# Patient Record
Sex: Male | Born: 1955
Health system: Southern US, Community
[De-identification: ages and names within clinical notes are randomized; demographics above are authoritative.]

## PROBLEM LIST (undated history)

## (undated) DIAGNOSIS — E079 Disorder of thyroid, unspecified: Secondary | ICD-10-CM

## (undated) DIAGNOSIS — I10 Essential (primary) hypertension: Secondary | ICD-10-CM

## (undated) DIAGNOSIS — E785 Hyperlipidemia, unspecified: Secondary | ICD-10-CM

## (undated) HISTORY — DX: Essential (primary) hypertension: I10

## (undated) HISTORY — PX: OTHER SURGICAL HISTORY: SHX169

## (undated) HISTORY — PX: BLADDER SURGERY: SHX569

## (undated) HISTORY — DX: Disorder of thyroid, unspecified: E07.9

## (undated) HISTORY — DX: Hyperlipidemia, unspecified: E78.5

---

## 2009-08-23 ENCOUNTER — Emergency Department (HOSPITAL_COMMUNITY): Admission: EM | Admit: 2009-08-23 | Discharge: 2009-08-23 | Payer: Self-pay | Admitting: Emergency Medicine

## 2010-12-27 LAB — URINALYSIS, ROUTINE W REFLEX MICROSCOPIC
Bilirubin Urine: NEGATIVE
Glucose, UA: NEGATIVE mg/dL
Ketones, ur: NEGATIVE mg/dL
Nitrite: NEGATIVE
Protein, ur: NEGATIVE mg/dL
Specific Gravity, Urine: 1.024 (ref 1.005–1.030)
pH: 6 (ref 5.0–8.0)

## 2010-12-27 LAB — COMPREHENSIVE METABOLIC PANEL
ALT: 25 U/L (ref 0–53)
Alkaline Phosphatase: 61 U/L (ref 39–117)
Chloride: 105 mEq/L (ref 96–112)
GFR calc non Af Amer: >60 mL/min (ref >60)

## 2010-12-27 LAB — DIFFERENTIAL
Basophils Absolute: 0 10*3/uL (ref 0.0–0.1)
Basophils Relative: 1 % (ref 0–1)
Eosinophils Absolute: 0.2 10*3/uL (ref 0.0–0.7)
Eosinophils Relative: 4 % (ref 0–5)
Lymphocytes Relative: 40 % (ref 12–46)
Lymphs Abs: 2.2 10*3/uL (ref 0.7–4.0)

## 2010-12-27 LAB — CBC
MCV: 92.1 fL (ref 78.0–100.0)
WBC: 5.5 10*3/uL (ref 4.0–10.5)

## 2011-12-19 ENCOUNTER — Ambulatory Visit (INDEPENDENT_AMBULATORY_CARE_PROVIDER_SITE_OTHER): Payer: BC Managed Care – PPO | Admitting: Family Medicine

## 2011-12-19 VITALS — BP 128/76 | HR 87 | Temp 98.4°F | Resp 18 | Ht 68.5 in | Wt 214.0 lb

## 2011-12-19 DIAGNOSIS — R599 Enlarged lymph nodes, unspecified: Secondary | ICD-10-CM

## 2011-12-19 DIAGNOSIS — M25879 Other specified joint disorders, unspecified ankle and foot: Secondary | ICD-10-CM

## 2011-12-19 DIAGNOSIS — E785 Hyperlipidemia, unspecified: Secondary | ICD-10-CM | POA: Insufficient documentation

## 2011-12-19 DIAGNOSIS — I10 Essential (primary) hypertension: Secondary | ICD-10-CM

## 2011-12-19 DIAGNOSIS — I129 Hypertensive chronic kidney disease with stage 1 through stage 4 chronic kidney disease, or unspecified chronic kidney disease: Secondary | ICD-10-CM | POA: Insufficient documentation

## 2011-12-19 LAB — POCT CBC
Granulocyte percent: 54.9 %G (ref 37–80)
HCT, POC: 46.1 % (ref 43.5–53.7)
Lymph, poc: 2.6 (ref 0.6–3.4)
MID (cbc): 0.7 (ref 0–0.9)
POC Granulocyte: 4.1 (ref 2–6.9)
POC LYMPH PERCENT: 35.2 %L (ref 10–50)
POC MID %: 9.9 %M (ref 0–12)
Platelet Count, POC: 275 10*3/uL (ref 142–424)

## 2011-12-19 NOTE — Progress Notes (Signed)
  Subjective:    Patient ID: Donald Zhang, male    DOB: 28-Feb-1956, 56 y.o.   MRN: [id number]  HPI Patient presents concerned about knots he discovered today on (R) nape and (L) dorsal surface of foot. Lesions are not tender. Denies recent illness though does have seasonal allergies. Denies injury to foot  though work involved very physical labor. Patient quit smoking 11/12 however recently experienced stressors and resumed smoking 1 week ago.  Smoking 3 cigarettes a day.   Review of Systems     Objective:   Physical Exam  Constitutional: He appears well-developed and well-nourished.  Cardiovascular: Normal rate, regular rhythm and normal heart sounds.   Pulmonary/Chest: Effort normal and breath sounds normal.  Lymphadenopathy:    He has cervical adenopathy (R) posterior cervical node.  Neurological: He is alert.  Skin: Skin is warm.       Soft non adherent lesion (R) posterior cervical region 1 cm X 1.5 cm  Cystic lesion (R) dorsal surface of foot with 2 cm x 1.5 cm lesion; fluctuant and not adherent to tendon          Assessment & Plan:

## 2011-12-25 ENCOUNTER — Other Ambulatory Visit: Payer: Self-pay

## 2012-01-28 ENCOUNTER — Ambulatory Visit (INDEPENDENT_AMBULATORY_CARE_PROVIDER_SITE_OTHER): Payer: BC Managed Care – PPO | Admitting: Family Medicine

## 2012-01-28 ENCOUNTER — Encounter: Payer: Self-pay | Admitting: Family Medicine

## 2012-01-28 VITALS — BP 129/72 | HR 66 | Temp 98.6°F | Resp 16 | Ht 68.5 in | Wt 206.0 lb

## 2012-01-28 DIAGNOSIS — F17200 Nicotine dependence, unspecified, uncomplicated: Secondary | ICD-10-CM

## 2012-01-28 DIAGNOSIS — I10 Essential (primary) hypertension: Secondary | ICD-10-CM

## 2012-01-28 DIAGNOSIS — E785 Hyperlipidemia, unspecified: Secondary | ICD-10-CM

## 2012-01-28 LAB — COMPREHENSIVE METABOLIC PANEL
AST: 16 U/L (ref 0–37)
Albumin: 4.3 g/dL (ref 3.5–5.2)
Alkaline Phosphatase: 50 U/L (ref 39–117)
Chloride: 108 mEq/L (ref 96–112)
Potassium: 4.5 mEq/L (ref 3.5–5.3)
Sodium: 142 mEq/L (ref 135–145)
Total Bilirubin: 0.5 mg/dL (ref 0.3–1.2)
Total Protein: 6.7 g/dL (ref 6.0–8.3)

## 2012-01-28 LAB — LIPID PANEL: VLDL: 37 mg/dL (ref 0–40)

## 2012-01-28 MED ORDER — LISINOPRIL-HYDROCHLOROTHIAZIDE 20-12.5 MG PO TABS: 1.0000 | ORAL_TABLET | Freq: Every day | ORAL | Status: DC

## 2012-01-28 MED ORDER — PRAVASTATIN SODIUM 40 MG PO TABS: 40.0000 mg | ORAL_TABLET | Freq: Every day | ORAL | Status: DC

## 2012-01-28 NOTE — Progress Notes (Signed)
  Subjective:    Patient ID: HYLAND MOLLENKOPF, male    DOB: 24-Oct-1955, 56 y.o.   MRN: [id number]  HPI  Patient presents for routine follow up of his medical problems  Compliant with his medications without side effects. Still smoking several cigarettes a day.   Follows very low salt diet. Very active at work however no regular exercise. Review of Systems  Eyes: Negative for visual disturbance.  Respiratory: Negative for shortness of breath.   Cardiovascular: Negative for chest pain and leg swelling.  Neurological: Negative for headaches.       Objective:   Physical Exam  Constitutional: He appears well-developed.  Eyes: Scleral icterus (muddy sclera) is present.  Neck: Neck supple.  Cardiovascular: Normal rate, regular rhythm and normal heart sounds.   Pulmonary/Chest: Effort normal and breath sounds normal.  Abdominal: Soft. Bowel sounds are normal.       No HSM  Musculoskeletal: Normal range of motion.  Neurological: He is alert.  Skin: Skin is warm.       No LE edema  Psychiatric: He has a normal mood and affect.        Assessment & Plan:   1. HTN (hypertension)  Lipid panel, Comprehensive metabolic panel, lisinopril-hydrochlorothiazide (PRINZIDE,ZESTORETIC) 20-12.5 MG per tablet  2. Dyslipidemia  pravastatin (PRAVACHOL) 40 MG tablet  3. Tobacco dependence  Encouraged cessation   Follow up 1 year for CPE unless blood work today dictates otherwise

## 2012-02-07 ENCOUNTER — Other Ambulatory Visit: Payer: Self-pay | Admitting: Family Medicine

## 2012-02-07 DIAGNOSIS — N189 Chronic kidney disease, unspecified: Secondary | ICD-10-CM

## 2012-02-07 DIAGNOSIS — I10 Essential (primary) hypertension: Secondary | ICD-10-CM

## 2012-02-07 DIAGNOSIS — E785 Hyperlipidemia, unspecified: Secondary | ICD-10-CM

## 2012-02-07 MED ORDER — ATORVASTATIN CALCIUM 40 MG PO TABS: 40.0000 mg | ORAL_TABLET | Freq: Every day | ORAL | Status: DC

## 2012-02-08 ENCOUNTER — Other Ambulatory Visit: Payer: Self-pay | Admitting: Family Medicine

## 2012-02-08 DIAGNOSIS — R945 Abnormal results of liver function studies: Secondary | ICD-10-CM

## 2012-02-08 DIAGNOSIS — R7989 Other specified abnormal findings of blood chemistry: Secondary | ICD-10-CM

## 2012-02-13 ENCOUNTER — Other Ambulatory Visit: Payer: Self-pay

## 2012-02-14 ENCOUNTER — Ambulatory Visit
Admission: RE | Admit: 2012-02-14 | Discharge: 2012-02-14 | Disposition: A | Discharge status: Home / Self Care | Payer: BC Managed Care – PPO | Source: Ambulatory Visit | Attending: Physician Assistant | Admitting: Physician Assistant

## 2012-02-14 ENCOUNTER — Ambulatory Visit
Admission: RE | Admit: 2012-02-14 | Discharge: 2012-02-14 | Disposition: A | Discharge status: Home / Self Care | Payer: BC Managed Care – PPO | Source: Ambulatory Visit | Attending: Family Medicine | Admitting: Family Medicine

## 2012-02-14 DIAGNOSIS — E785 Hyperlipidemia, unspecified: Secondary | ICD-10-CM

## 2012-02-14 DIAGNOSIS — N189 Chronic kidney disease, unspecified: Secondary | ICD-10-CM

## 2012-02-14 DIAGNOSIS — I10 Essential (primary) hypertension: Secondary | ICD-10-CM

## 2012-02-14 DIAGNOSIS — R945 Abnormal results of liver function studies: Secondary | ICD-10-CM

## 2012-02-27 ENCOUNTER — Telehealth: Payer: Self-pay | Admitting: Radiology

## 2012-02-27 NOTE — Telephone Encounter (Signed)
Could not leave message on machine--it said "voicemail has not been set up yet"

## 2012-02-27 NOTE — Telephone Encounter (Signed)
Message copied by Luretha Murphy on Wed Feb 27, 2012  8:45 AM ------      Message from: Dois Davenport      Created: Sun Feb 24, 2012  5:20 PM       Please contact patient and let him know his renal artery dopplers are essentially normal      Encourage him to see Dr. Audria Nine the end of June to recheck renal function.

## 2012-03-11 ENCOUNTER — Telehealth: Payer: Self-pay | Admitting: Radiology

## 2012-03-11 NOTE — Telephone Encounter (Signed)
GAVE PT RESULTS OF U/S.

## 2012-03-11 NOTE — Telephone Encounter (Signed)
Message copied by Luretha Murphy on Tue Mar 11, 2012  8:51 AM ------      Message from: Fidel Levy      Created: Fri Mar 07, 2012  8:32 AM                   ----- Message -----         From: Earlie Counts, [place]         Sent: 03/07/2012   8:32 AM           To: [facility] Message Pool                        ----- Message -----         From: Dois Davenport, MD         Sent: 02/24/2012   5:20 PM           To: [facility] Clinical Message Pool            Please contact patient and let him know his renal artery dopplers are essentially normal      Encourage him to see Dr. Audria Nine the end of June to recheck renal function.

## 2012-11-22 DIAGNOSIS — Z0271 Encounter for disability determination: Secondary | ICD-10-CM

## 2013-01-04 ENCOUNTER — Ambulatory Visit: Payer: BC Managed Care – PPO

## 2013-01-04 ENCOUNTER — Ambulatory Visit (INDEPENDENT_AMBULATORY_CARE_PROVIDER_SITE_OTHER): Payer: BC Managed Care – PPO | Admitting: Emergency Medicine

## 2013-01-04 VITALS — BP 146/90 | HR 79 | Temp 98.2°F | Resp 18 | Ht 69.0 in | Wt 194.2 lb

## 2013-01-04 DIAGNOSIS — Z0189 Encounter for other specified special examinations: Secondary | ICD-10-CM

## 2013-01-04 DIAGNOSIS — S8000XA Contusion of unspecified knee, initial encounter: Secondary | ICD-10-CM

## 2013-01-04 DIAGNOSIS — S8002XA Contusion of left knee, initial encounter: Secondary | ICD-10-CM

## 2013-01-04 MED ORDER — HYDROCODONE-ACETAMINOPHEN 5-325 MG PO TABS: 1.0000 | ORAL_TABLET | Freq: Four times a day (QID) | ORAL | Status: DC | PRN

## 2013-01-04 NOTE — Progress Notes (Signed)
  Subjective:    Patient ID: Donald Zhang, male    DOB: [birth year], 56 y.o.   MRN: [id number]  HPI 10 days ago patient was at home and he struck his left knee are cross tie. Since that time he has had significant pain and swelling where he struck his knee. On Friday he put up 200 bails of hay.    Review of Systems     Objective:   Physical Exam there is tenderness and swelling over the lateral femoral condyle and over the lateral portion of the patella. There is no joint effusion. There is no joint instability. He has pain in the area of swelling with flexion extension  [facility] reading (PRIMARY) by  Dr. 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       Assessment & Plan:  Injury to knee. 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 Patient placed in the knee immobilizer. We'll follow up with orthopedic surgeon he is given medications for pain and a week at work

## 2013-04-17 ENCOUNTER — Telehealth: Payer: Self-pay

## 2013-04-17 DIAGNOSIS — I10 Essential (primary) hypertension: Secondary | ICD-10-CM

## 2013-04-17 MED ORDER — LISINOPRIL-HYDROCHLOROTHIAZIDE 20-12.5 MG PO TABS: 1.0000 | ORAL_TABLET | Freq: Every day | ORAL | Status: DC

## 2013-04-17 NOTE — Telephone Encounter (Signed)
Pharm reqs RF of lisinopril-HCTZ. Pt is overdue for OV. Will RF x 1 mos w/notice needs OV.

## 2013-11-02 ENCOUNTER — Ambulatory Visit (INDEPENDENT_AMBULATORY_CARE_PROVIDER_SITE_OTHER): Payer: BC Managed Care – PPO | Admitting: Family Medicine

## 2013-11-02 ENCOUNTER — Encounter: Payer: Self-pay | Admitting: Family Medicine

## 2013-11-02 ENCOUNTER — Ambulatory Visit: Payer: BC Managed Care – PPO

## 2013-11-02 VITALS — BP 160/100 | HR 67 | Temp 98.0°F | Resp 16 | Ht 68.75 in | Wt 196.0 lb

## 2013-11-02 DIAGNOSIS — R590 Localized enlarged lymph nodes: Secondary | ICD-10-CM

## 2013-11-02 DIAGNOSIS — I491 Atrial premature depolarization: Secondary | ICD-10-CM

## 2013-11-02 DIAGNOSIS — D170 Benign lipomatous neoplasm of skin and subcutaneous tissue of head, face and neck: Secondary | ICD-10-CM

## 2013-11-02 DIAGNOSIS — L918 Other hypertrophic disorders of the skin: Secondary | ICD-10-CM

## 2013-11-02 DIAGNOSIS — L309 Dermatitis, unspecified: Secondary | ICD-10-CM

## 2013-11-02 DIAGNOSIS — I10 Essential (primary) hypertension: Secondary | ICD-10-CM

## 2013-11-02 DIAGNOSIS — D1739 Benign lipomatous neoplasm of skin and subcutaneous tissue of other sites: Secondary | ICD-10-CM

## 2013-11-02 DIAGNOSIS — L259 Unspecified contact dermatitis, unspecified cause: Secondary | ICD-10-CM

## 2013-11-02 DIAGNOSIS — F17200 Nicotine dependence, unspecified, uncomplicated: Secondary | ICD-10-CM

## 2013-11-02 DIAGNOSIS — L919 Hypertrophic disorder of the skin, unspecified: Secondary | ICD-10-CM

## 2013-11-02 DIAGNOSIS — L909 Atrophic disorder of skin, unspecified: Secondary | ICD-10-CM

## 2013-11-02 DIAGNOSIS — E785 Hyperlipidemia, unspecified: Secondary | ICD-10-CM

## 2013-11-02 LAB — COMPREHENSIVE METABOLIC PANEL
ALBUMIN: 4.3 g/dL (ref 3.5–5.2)
ALT: 12 U/L (ref 0–53)
AST: 16 U/L (ref 0–37)
Alkaline Phosphatase: 58 U/L (ref 39–117)
BUN: 12 mg/dL (ref 6–23)
CALCIUM: 9.2 mg/dL (ref 8.4–10.5)
CHLORIDE: 105 meq/L (ref 96–112)
CO2: 27 meq/L (ref 19–32)
CREATININE: 1.25 mg/dL (ref 0.50–1.35)
GLUCOSE: 95 mg/dL (ref 70–99)
POTASSIUM: 4.7 meq/L (ref 3.5–5.3)
Sodium: 139 mEq/L (ref 135–145)
Total Bilirubin: 0.7 mg/dL (ref 0.2–1.2)
Total Protein: 6.8 g/dL (ref 6.0–8.3)

## 2013-11-02 LAB — POCT URINALYSIS DIPSTICK
BILIRUBIN UA: NEGATIVE
Glucose, UA: NEGATIVE
Ketones, UA: NEGATIVE
Leukocytes, UA: NEGATIVE
Nitrite, UA: NEGATIVE
Protein, UA: NEGATIVE
Spec Grav, UA: 1.02
Urobilinogen, UA: 0.2
pH, UA: 6.5

## 2013-11-02 LAB — POCT CBC
Granulocyte percent: 64.8 %G (ref 37–80)
HCT, POC: 47.2 % (ref 43.5–53.7)
Hemoglobin: 15 g/dL (ref 14.1–18.1)
LYMPH, POC: 1.7 (ref 0.6–3.4)
MCH, POC: 30.5 pg (ref 27–31.2)
MCHC: 31.8 g/dL (ref 31.8–35.4)
MCV: 96 fL (ref 80–97)
MID (CBC): 0.3 (ref 0–0.9)
MPV: 9.4 fL (ref 0–99.8)
POC Granulocyte: 3.7 (ref 2–6.9)
POC LYMPH PERCENT: 29.3 %L (ref 10–50)
POC MID %: 5.9 % (ref 0–12)
Platelet Count, POC: 215 10*3/uL (ref 142–424)
RBC: 4.92 M/uL (ref 4.69–6.13)
RDW, POC: 13.9 %
WBC: 5.7 10*3/uL (ref 4.6–10.2)

## 2013-11-02 LAB — LIPID PANEL
Cholesterol: 146 mg/dL (ref 0–200)
HDL: 35 mg/dL — AB (ref >39)
LDL Cholesterol: 90 mg/dL (ref 0–99)
Total CHOL/HDL Ratio: 4.2 Ratio
Triglycerides: 104 mg/dL (ref <150)
VLDL: 21 mg/dL (ref 0–40)

## 2013-11-02 MED ORDER — TRIAMCINOLONE ACETONIDE 0.1 % EX CREA: 1.0000 "application " | TOPICAL_CREAM | Freq: Two times a day (BID) | CUTANEOUS | Status: DC

## 2013-11-02 MED ORDER — LISINOPRIL-HYDROCHLOROTHIAZIDE 20-12.5 MG PO TABS: ORAL_TABLET | ORAL | Status: DC

## 2013-11-02 MED ORDER — ATORVASTATIN CALCIUM 40 MG PO TABS: 40.0000 mg | ORAL_TABLET | Freq: Every day | ORAL | Status: DC

## 2013-11-02 NOTE — Patient Instructions (Addendum)
Use the triamcinolone cream twice daily for one week on the rashes, then once daily usually will take care of it.  Work hard on stopping cigarettes  Resume blood pressure and cholesterol medication  Return in about 3 months to recheck the blood pressure and to recheck the lymph gland in your neck

## 2013-11-02 NOTE — Progress Notes (Addendum)
Subjective: 58 year old man who is here for several things. He has a history of high cholesterol and high blood pressure. He has been out of medications for about a year and only now is getting around come back in to discuss getting them refilled. His wife has been nagging him about it. He smokes. He is work for [employer] for over 20 years. He denies any headaches, dizziness, chest pains, shortness of breath, GI complaints. He had a skin tag on the left side of his neck he would like taken off. He has a rash around his neck he wondered whether it was from the medications last year but it continued to come and go. He also has some on his left anterior chest wall of the same kind of rash. He does not do any regular physical exercise. He has a knot on the right side of his neck posteriorly that he says was noted by somebody a couple of years ago but he thinks it gradually grams a little bit.  Objective: Pleasant gentleman in no major distress. Throat clear. Neck supple with the 2 CM nodular area left posterior -lateral neck which is mobile, fairly soft. On the anterior left portion of the neck and anterior triangle is a 1 CM node, fairly firm, freely mobile. No carotid bruits. Chest clear. Heart irregular without any murmurs or gallops. Abdomen soft nontender he has a rash around his neck and on his left chest wall which looks eczematoid.  Assessment: Hypertension Hyperlipidemia Arrhythmia Cervical lymphadenopathy Right cervical lipoma Eczematoid dermatitis Skin left neck Tobacco abuse    Plan: CBC, CMP, lipid, EKG, UA ,Chest x-ray Resume lipid and hypertensive medications Cryosurgery lesion on Cream for the rash  [facility] reading (PRIMARY) by  Dr. Linna Darner Minimal right costophrenic angle blunting  Results for orders placed in visit on 11/02/13  POCT CBC      Result Value Range   WBC 5.7  4.6 - 10.2 K/uL   Lymph, poc 1.7  0.6 - 3.4   POC LYMPH PERCENT 29.3  10 - 50 %L   MID (cbc) 0.3  0 - 0.9    POC MID % 5.9  0 - 12 %M   POC Granulocyte 3.7  2 - 6.9   Granulocyte percent 64.8  37 - 80 %G   RBC 4.92  4.69 - 6.13 M/uL   Hemoglobin 15.0  14.1 - 18.1 g/dL   HCT, POC 47.2  43.5 - 53.7 %   MCV 96.0  80 - 97 fL   MCH, POC 30.5  27 - 31.2 pg   MCHC 31.8  31.8 - 35.4 g/dL   RDW, POC 13.9     Platelet Count, POC 215  142 - 424 K/uL   MPV 9.4  0 - 99.8 fL  POCT URINALYSIS DIPSTICK      Result Value Range   Color, UA yellow     Clarity, UA clear     Glucose, UA neg     Bilirubin, UA neg     Ketones, UA neg     Spec Grav, UA 1.020     Blood, UA trace-intact     pH, UA 6.5     Protein, UA neg     Urobilinogen, UA 0.2     Nitrite, UA neg     Leukocytes, UA Negative      Procedure note: The lesion on a left side of his neck was treated with cryosurgery free stall refreeze technique. He tolerated this well. It  should fall off in a week or so. If he has problems he is to return  Urged to stop smoking  Resume blood pressure and cholesterol medication. Use triamcinolone cream on neck  When he returns check the left anterior cervical node. If it persists may need referral for biopsy  Monitor the lipoma on the right side of the neck.  Discussed smoking cessation,  He had questions about vapors .  Over 50 minutes of care plus the procedure

## 2013-11-02 NOTE — Addendum Note (Signed)
Addended by: HOPPER, DAVID H on: 11/02/2013 09:44 AM   Modules accepted: Level of Service

## 2015-02-02 ENCOUNTER — Encounter (HOSPITAL_COMMUNITY): Payer: Self-pay | Admitting: Family Medicine

## 2015-02-02 ENCOUNTER — Emergency Department (HOSPITAL_COMMUNITY)
Admission: EM | Admit: 2015-02-02 | Discharge: 2015-02-02 | Disposition: A | Discharge status: Home / Self Care | Payer: BLUE CROSS/BLUE SHIELD | Attending: Emergency Medicine | Admitting: Emergency Medicine

## 2015-02-02 DIAGNOSIS — Z720 Tobacco use: Secondary | ICD-10-CM | POA: Diagnosis not present

## 2015-02-02 DIAGNOSIS — E785 Hyperlipidemia, unspecified: Secondary | ICD-10-CM | POA: Insufficient documentation

## 2015-02-02 DIAGNOSIS — R109 Unspecified abdominal pain: Secondary | ICD-10-CM | POA: Diagnosis not present

## 2015-02-02 DIAGNOSIS — I10 Essential (primary) hypertension: Secondary | ICD-10-CM | POA: Diagnosis not present

## 2015-02-02 DIAGNOSIS — Z79899 Other long term (current) drug therapy: Secondary | ICD-10-CM | POA: Diagnosis not present

## 2015-02-02 LAB — COMPREHENSIVE METABOLIC PANEL
ALT: 20 U/L (ref 17–63)
AST: 18 U/L (ref 15–41)
Albumin: 3.9 g/dL (ref 3.5–5.0)
Alkaline Phosphatase: 59 U/L (ref 38–126)
Anion gap: 7 (ref 5–15)
BILIRUBIN TOTAL: 0.5 mg/dL (ref 0.3–1.2)
BUN: 17 mg/dL (ref 6–20)
CHLORIDE: 107 mmol/L (ref 101–111)
CO2: 24 mmol/L (ref 22–32)
CREATININE: 1.77 mg/dL — AB (ref 0.61–1.24)
Calcium: 9.2 mg/dL (ref 8.9–10.3)
GFR calc Af Amer: 47 mL/min — ABNORMAL LOW (ref >60)
GFR calc non Af Amer: 41 mL/min — ABNORMAL LOW (ref >60)
GLUCOSE: 91 mg/dL (ref 70–99)
Potassium: 4.2 mmol/L (ref 3.5–5.1)
Sodium: 138 mmol/L (ref 135–145)
Total Protein: 6.9 g/dL (ref 6.5–8.1)

## 2015-02-02 LAB — URINALYSIS, ROUTINE W REFLEX MICROSCOPIC
BILIRUBIN URINE: NEGATIVE
Glucose, UA: NEGATIVE mg/dL
Hgb urine dipstick: NEGATIVE
KETONES UR: NEGATIVE mg/dL
NITRITE: NEGATIVE
PROTEIN: NEGATIVE mg/dL
Specific Gravity, Urine: 1.017 (ref 1.005–1.030)
UROBILINOGEN UA: 0.2 mg/dL (ref 0.0–1.0)
pH: 5 (ref 5.0–8.0)

## 2015-02-02 LAB — CBC WITH DIFFERENTIAL/PLATELET
BASOS ABS: 0.1 10*3/uL (ref 0.0–0.1)
Basophils Relative: 1 % (ref 0–1)
EOS ABS: 0.4 10*3/uL (ref 0.0–0.7)
Eosinophils Relative: 5 % (ref 0–5)
HEMATOCRIT: 40.3 % (ref 39.0–52.0)
Hemoglobin: 14 g/dL (ref 13.0–17.0)
LYMPHS PCT: 32 % (ref 12–46)
Lymphs Abs: 2.5 10*3/uL (ref 0.7–4.0)
MCH: 31 pg (ref 26.0–34.0)
MCHC: 34.7 g/dL (ref 30.0–36.0)
MCV: 89.2 fL (ref 78.0–100.0)
MONOS PCT: 4 % (ref 3–12)
Monocytes Absolute: 0.3 10*3/uL (ref 0.1–1.0)
NEUTROS ABS: 4.4 10*3/uL (ref 1.7–7.7)
NEUTROS PCT: 58 % (ref 43–77)
Platelets: 205 10*3/uL (ref 150–400)
RBC: 4.52 MIL/uL (ref 4.22–5.81)
RDW: 13.3 % (ref 11.5–15.5)
WBC: 7.7 10*3/uL (ref 4.0–10.5)

## 2015-02-02 LAB — URINE MICROSCOPIC-ADD ON

## 2015-02-02 LAB — LIPASE, BLOOD: Lipase: 34 U/L (ref 22–51)

## 2015-02-02 MED ORDER — MORPHINE SULFATE 4 MG/ML IJ SOLN
4.0000 mg | Freq: Once | INTRAMUSCULAR | Status: AC
  Administered 2015-02-02: 4 mg via INTRAVENOUS
  Filled 2015-02-02: qty 1

## 2015-02-02 MED ORDER — SODIUM CHLORIDE 0.9 % IV BOLUS (SEPSIS)
1000.0000 mL | Freq: Once | INTRAVENOUS | Status: AC
Start: 2015-02-02 — End: 2015-02-02
  Administered 2015-02-02: 1000 mL via INTRAVENOUS

## 2015-02-02 MED ORDER — ONDANSETRON HCL 4 MG/2ML IJ SOLN
4.0000 mg | Freq: Once | INTRAMUSCULAR | Status: AC
  Administered 2015-02-02: 4 mg via INTRAVENOUS
  Filled 2015-02-02: qty 2

## 2015-02-02 MED ORDER — HYDROCODONE-ACETAMINOPHEN 5-325 MG PO TABS: 1.0000 | ORAL_TABLET | Freq: Four times a day (QID) | ORAL | Status: DC | PRN

## 2015-02-02 NOTE — Discharge Instructions (Signed)
Flank Pain °Flank pain refers to pain that is located on the side of the body between the upper abdomen and the back. The pain may occur over a short period of time (acute) or may be long-term or reoccurring (chronic). It may be mild or severe. Flank pain can be caused by many things. °CAUSES  °Some of the more common causes of flank pain include: °· Muscle strains.   °· Muscle spasms.   °· A disease of your spine (vertebral disk disease).   °· A lung infection (pneumonia).   °· Fluid around your lungs (pulmonary edema).   °· A kidney infection.   °· Kidney stones.   °· A very painful skin rash caused by the chickenpox virus (shingles).   °· Gallbladder disease.   °HOME CARE INSTRUCTIONS  °Home care will depend on the cause of your pain. In general, °· Rest as directed by your caregiver. °· Drink enough fluids to keep your urine clear or pale yellow. °· Only take over-the-counter or prescription medicines as directed by your caregiver. Some medicines may help relieve the pain. °· Tell your caregiver about any changes in your pain. °· Follow up with your caregiver as directed. °SEEK IMMEDIATE MEDICAL CARE IF:  °· Your pain is not controlled with medicine.   °· You have new or worsening symptoms. °· Your pain increases.   °· You have abdominal pain.   °· You have shortness of breath.   °· You have persistent nausea or vomiting.   °· You have swelling in your abdomen.   °· You feel faint or pass out.   °· You have blood in your urine. °· You have a fever or persistent symptoms for more than 2-3 days. °· You have a fever and your symptoms suddenly get worse. °MAKE SURE YOU:  °· Understand these instructions. °· Will watch your condition. °· Will get help right away if you are not doing well or get worse. °Document Released: 11/01/2005 Document Revised: 06/04/2012 Document Reviewed: 04/24/2012 °[employer]® Patient Information ©2015 [employer]. This information is not intended to replace advice given to you by your  health care provider. Make sure you discuss any questions you have with your health care provider. ° °

## 2015-02-02 NOTE — ED Notes (Signed)
Pt here for right flank pain. Denies any N,V,D. Denies urinary complaints.

## 2015-02-02 NOTE — ED Provider Notes (Signed)
I saw and evaluated the patient, reviewed the resident's note and I agree with the findings and plan.  Pertinent History: The patient has flank pain on the right side, radiates to the right lateral abdomen, no lower abdominal tenderness, is worse with position, it is not colicky or spontaneous, on exam the patient has reproducible tenderness along the back, very uncomfortable when he changes position, bedside ultrasound reveals no hydronephrosis, labs normal except for slight increase in renal dysfunction based on elevated creatinine, check urinalysis for infection or hematuria, CT scan for hematuria otherwise anticipate pain control and outpatient follow-up.  Emergency Focused Ultrasound Exam Limited retroperitoneal ultrasound of kidneys  Performed and interpreted by Dr. Sabra Heck Indication: flank pain Focused abdominal ultrasound with both kidneys imaged in transverse and longitudinal planes in real-time. Interpretation: No hydronephrosis visualized.   Images archived electronically  Doubt renal source given [place], pain worsening with movement and UA results, stable for d/c.  Final diagnoses:  Flank pain      Noemi Chapel, MD 02/03/15 2157

## 2015-02-02 NOTE — ED Provider Notes (Signed)
CSN: [id number]     Arrival date & time 02/02/15  1210 History   First MD Initiated Contact with Patient 02/02/15 1545     Chief Complaint  Patient presents with  . Flank Pain     (Consider location/radiation/quality/duration/timing/severity/associated sxs/prior Treatment) HPI Comments: 59 year old male with 2-3 days worth of right-sided flank pain. Patient states it is been intermittent. No aggravating or relieving factors noted. Patient's had no trauma or falls etc.  Patient is a 59 y.o. male presenting with flank pain.  Flank Pain This is a new problem. Episode onset: 3 days ago. The problem occurs intermittently. The problem has been unchanged. Associated symptoms include abdominal pain. Pertinent negatives include no chest pain, congestion, coughing, fever, headaches or rash. Nothing aggravates the symptoms. He has tried nothing for the symptoms. The treatment provided no relief.    Past Medical History  Diagnosis Date  . Hyperlipidemia   . Hypertension    History reviewed. No pertinent past surgical history. Family History  Problem Relation Age of Onset  . Hypertension Mother   . Diabetes Father   . Diabetes Brother    History  Substance Use Topics  . Smoking status: Current Every Day Smoker  . Smokeless tobacco: Not on file  . Alcohol Use: 3.6 oz/week    6 Cans of beer per week    Review of Systems  Constitutional: Negative for fever.  HENT: Negative for congestion.   Respiratory: Negative for cough.   Cardiovascular: Negative for chest pain.  Gastrointestinal: Positive for abdominal pain.  Endocrine: Negative for polyuria.  Genitourinary: Positive for flank pain.  Musculoskeletal: Negative for back pain.  Skin: Negative for rash.  Neurological: Negative for headaches.  Psychiatric/Behavioral: Negative for confusion.  All other systems reviewed and are negative.     Allergies  Review of patient's allergies indicates no known allergies.  Home Medications    Prior to Admission medications   Medication Sig Start Date End Date Taking? Authorizing Provider  lisinopril-hydrochlorothiazide (PRINZIDE,ZESTORETIC) 20-12.5 MG per tablet Take one daily for blood pressure Patient taking differently: Take 1 tablet by mouth daily. Take one daily for blood pressure 11/02/13  Yes Posey Boyer, MD  atorvastatin (LIPITOR) 40 MG tablet Take 1 tablet (40 mg total) by mouth daily. 11/02/13 11/02/14  Posey Boyer, MD  HYDROcodone-acetaminophen (NORCO/VICODIN) 5-325 MG per tablet Take 1-2 tablets by mouth every 6 (six) hours as needed. 02/02/15   Robynn Pane, MD  triamcinolone cream (KENALOG) 0.1 % Apply 1 application topically 2 (two) times daily. Patient not taking: Reported on 02/02/2015 11/02/13   Posey Boyer, MD   BP 134/87 mmHg  Pulse 74  Temp(Src) 98 F (36.7 C) (Oral)  Resp 20  SpO2 99% Physical Exam  Constitutional: He appears well-developed.  HENT:  Head: Normocephalic and atraumatic.  Eyes: Pupils are equal, round, and reactive to light.  Neck: Normal range of motion.  Cardiovascular: Normal rate and intact distal pulses.   Pulmonary/Chest: Effort normal. No respiratory distress.  Abdominal: Soft. He exhibits no distension.  Musculoskeletal: Normal range of motion.  Neurological: He is alert. No cranial nerve deficit.  Skin: Skin is warm. No rash noted.  Psychiatric: He has a normal mood and affect.  Vitals reviewed.   ED Course  Procedures (including critical care time) Labs Review Labs Reviewed  COMPREHENSIVE METABOLIC PANEL - Abnormal; Notable for the following:    Creatinine, Ser 1.77 (*)    GFR calc non Af Amer 41 (*)  GFR calc Af Amer 47 (*)    All other components within normal limits  URINALYSIS, ROUTINE W REFLEX MICROSCOPIC - Abnormal; Notable for the following:    Leukocytes, UA TRACE (*)    All other components within normal limits  CBC WITH DIFFERENTIAL/PLATELET  LIPASE, BLOOD  URINE MICROSCOPIC-ADD ON    Imaging  Review No results found.   EKG Interpretation None      MDM  59 year old male presents with flank pain. On exam patient is describing true flank pain but rather pain directly over the right side iliac crest. Patient's pain is somewhat reproducible with palpation however he is able to easily reproduces with certain torquing movements of his torso. This most consistent with musculoskeletal type pain. History atypical for renal colic renal dysfunction or kidney stones. Additionally labs and UA obtained. UA showed no signs of blood. CMP showed only a minor bump in creatinine from apparent baseline of 1.3 to 1.7. Bedside ultrasound showed no signs of hydronephrosis or obstruction. Patient has no true abdominal pain. Remainder physical exam is benign. This time do not suspect a serious pathology. We've recommended follow-up in 3 days with PCP for reevaluation flank pain as well as creatinine rechecked. Have discussed return cautious with patient and wife at bedside both of which voice understanding. Patient appropriate for outpatient management no further acute issues    Final diagnoses:  Flank pain        Robynn Pane, MD 02/02/15 1811  Noemi Chapel, MD 02/03/15 2157

## 2015-02-04 ENCOUNTER — Ambulatory Visit
Admission: RE | Admit: 2015-02-04 | Discharge: 2015-02-04 | Disposition: A | Discharge status: Home / Self Care | Payer: BLUE CROSS/BLUE SHIELD | Source: Ambulatory Visit | Attending: Emergency Medicine | Admitting: Emergency Medicine

## 2015-02-04 ENCOUNTER — Encounter (HOSPITAL_COMMUNITY): Payer: Self-pay | Admitting: Emergency Medicine

## 2015-02-04 ENCOUNTER — Ambulatory Visit (INDEPENDENT_AMBULATORY_CARE_PROVIDER_SITE_OTHER): Payer: BLUE CROSS/BLUE SHIELD | Admitting: Emergency Medicine

## 2015-02-04 ENCOUNTER — Emergency Department (HOSPITAL_COMMUNITY)
Admission: EM | Admit: 2015-02-04 | Discharge: 2015-02-04 | Disposition: A | Discharge status: Home / Self Care | Payer: BLUE CROSS/BLUE SHIELD | Attending: Emergency Medicine | Admitting: Emergency Medicine

## 2015-02-04 ENCOUNTER — Encounter (HOSPITAL_COMMUNITY)
Admission: EM | Disposition: A | Discharge status: Home / Self Care | Payer: Self-pay | Source: Home / Self Care | Attending: Emergency Medicine

## 2015-02-04 ENCOUNTER — Emergency Department (HOSPITAL_COMMUNITY): Payer: BLUE CROSS/BLUE SHIELD | Admitting: Anesthesiology

## 2015-02-04 ENCOUNTER — Ambulatory Visit (INDEPENDENT_AMBULATORY_CARE_PROVIDER_SITE_OTHER): Payer: BLUE CROSS/BLUE SHIELD

## 2015-02-04 VITALS — BP 134/82 | HR 60 | Temp 98.4°F | Resp 20 | Ht 69.0 in | Wt 198.1 lb

## 2015-02-04 DIAGNOSIS — F17210 Nicotine dependence, cigarettes, uncomplicated: Secondary | ICD-10-CM | POA: Diagnosis not present

## 2015-02-04 DIAGNOSIS — N200 Calculus of kidney: Secondary | ICD-10-CM | POA: Diagnosis not present

## 2015-02-04 DIAGNOSIS — R1031 Right lower quadrant pain: Secondary | ICD-10-CM

## 2015-02-04 DIAGNOSIS — E785 Hyperlipidemia, unspecified: Secondary | ICD-10-CM

## 2015-02-04 DIAGNOSIS — N201 Calculus of ureter: Secondary | ICD-10-CM | POA: Diagnosis not present

## 2015-02-04 DIAGNOSIS — I10 Essential (primary) hypertension: Secondary | ICD-10-CM | POA: Diagnosis not present

## 2015-02-04 DIAGNOSIS — R109 Unspecified abdominal pain: Secondary | ICD-10-CM

## 2015-02-04 HISTORY — PX: HOLMIUM LASER APPLICATION: SHX5852

## 2015-02-04 HISTORY — PX: CYSTOSCOPY/RETROGRADE/URETEROSCOPY/STONE EXTRACTION WITH BASKET: SHX5317

## 2015-02-04 LAB — POCT URINALYSIS DIPSTICK
Bilirubin, UA: NEGATIVE
Blood, UA: NEGATIVE
GLUCOSE UA: NEGATIVE
KETONES UA: NEGATIVE
Leukocytes, UA: NEGATIVE
Nitrite, UA: NEGATIVE
Protein, UA: NEGATIVE
SPEC GRAV UA: 1.02
Urobilinogen, UA: 0.2
pH, UA: 5.5

## 2015-02-04 LAB — COMPLETE METABOLIC PANEL WITH GFR
ALT: 15 U/L (ref 0–53)
AST: 15 U/L (ref 0–37)
Albumin: 4.3 g/dL (ref 3.5–5.2)
Alkaline Phosphatase: 61 U/L (ref 39–117)
BILIRUBIN TOTAL: 0.6 mg/dL (ref 0.2–1.2)
BUN: 18 mg/dL (ref 6–23)
CALCIUM: 9.6 mg/dL (ref 8.4–10.5)
CO2: 29 meq/L (ref 19–32)
Chloride: 105 mEq/L (ref 96–112)
Creat: 1.73 mg/dL — ABNORMAL HIGH (ref 0.50–1.35)
GFR, EST AFRICAN AMERICAN: 50 mL/min — AB
GFR, Est Non African American: 43 mL/min — ABNORMAL LOW
Glucose, Bld: 85 mg/dL (ref 70–99)
Potassium: 4.6 mEq/L (ref 3.5–5.3)
Sodium: 140 mEq/L (ref 135–145)
Total Protein: 7.3 g/dL (ref 6.0–8.3)

## 2015-02-04 LAB — POCT CBC
Granulocyte percent: 70 %G (ref 37–80)
HCT, POC: 45.3 % (ref 43.5–53.7)
Hemoglobin: 14.7 g/dL (ref 14.1–18.1)
LYMPH, POC: 1.7 (ref 0.6–3.4)
MCH, POC: 29.2 pg (ref 27–31.2)
MCHC: 32.5 g/dL (ref 31.8–35.4)
MCV: 89.9 fL (ref 80–97)
MID (CBC): 0.3 (ref 0–0.9)
MPV: 7.5 fL (ref 0–99.8)
PLATELET COUNT, POC: 206 10*3/uL (ref 142–424)
POC Granulocyte: 4.6 (ref 2–6.9)
POC LYMPH PERCENT: 25.8 %L (ref 10–50)
POC MID %: 4.2 % (ref 0–12)
RBC: 5.04 M/uL (ref 4.69–6.13)
RDW, POC: 13.3 %
WBC: 6.5 10*3/uL (ref 4.6–10.2)

## 2015-02-04 LAB — POCT UA - MICROSCOPIC ONLY
BACTERIA, U MICROSCOPIC: NEGATIVE
CASTS, UR, LPF, POC: NEGATIVE
Crystals, Ur, HPF, POC: NEGATIVE
Epithelial cells, urine per micros: NEGATIVE
MUCUS UA: NEGATIVE
YEAST UA: NEGATIVE

## 2015-02-04 LAB — LIPID PANEL
CHOLESTEROL: 146 mg/dL (ref 0–200)
HDL: 34 mg/dL — ABNORMAL LOW (ref >=40)
LDL Cholesterol: 84 mg/dL (ref 0–99)
Total CHOL/HDL Ratio: 4.3 Ratio
Triglycerides: 141 mg/dL (ref <150)
VLDL: 28 mg/dL (ref 0–40)

## 2015-02-04 SURGERY — CYSTOSCOPY, WITH CALCULUS REMOVAL USING BASKET
Anesthesia: General | Site: Ureter | Laterality: Right

## 2015-02-04 MED ORDER — DEXAMETHASONE SODIUM PHOSPHATE 10 MG/ML IJ SOLN
INTRAMUSCULAR | Status: AC
  Filled 2015-02-04: qty 2

## 2015-02-04 MED ORDER — BELLADONNA ALKALOIDS-OPIUM 16.2-60 MG RE SUPP
RECTAL | Status: AC
  Filled 2015-02-04: qty 1

## 2015-02-04 MED ORDER — ONDANSETRON HCL 4 MG/2ML IJ SOLN
INTRAMUSCULAR | Status: DC | PRN
  Administered 2015-02-04: 4 mg via INTRAVENOUS

## 2015-02-04 MED ORDER — LIDOCAINE HCL 2 % EX GEL
CUTANEOUS | Status: AC
  Filled 2015-02-04: qty 10

## 2015-02-04 MED ORDER — CIPROFLOXACIN IN D5W 400 MG/200ML IV SOLN
INTRAVENOUS | Status: AC
  Filled 2015-02-04: qty 200

## 2015-02-04 MED ORDER — ONDANSETRON HCL 4 MG/2ML IJ SOLN: 4.0000 mg | Freq: Once | INTRAMUSCULAR | Status: DC | PRN

## 2015-02-04 MED ORDER — FENTANYL CITRATE (PF) 250 MCG/5ML IJ SOLN
INTRAMUSCULAR | Status: AC
  Filled 2015-02-04: qty 5

## 2015-02-04 MED ORDER — LACTATED RINGERS IV SOLN
INTRAVENOUS | Status: DC | PRN
  Administered 2015-02-04: 20:00:00 via INTRAVENOUS

## 2015-02-04 MED ORDER — SUCCINYLCHOLINE CHLORIDE 20 MG/ML IJ SOLN
INTRAMUSCULAR | Status: DC | PRN
  Administered 2015-02-04: 100 mg via INTRAVENOUS

## 2015-02-04 MED ORDER — HYDROMORPHONE HCL 1 MG/ML IJ SOLN: 0.5000 mg | Freq: Once | INTRAMUSCULAR | Status: DC

## 2015-02-04 MED ORDER — ONDANSETRON HCL 4 MG/2ML IJ SOLN
4.0000 mg | Freq: Once | INTRAMUSCULAR | Status: AC
  Administered 2015-02-04: 4 mg via INTRAVENOUS
  Filled 2015-02-04: qty 2

## 2015-02-04 MED ORDER — ATORVASTATIN CALCIUM 40 MG PO TABS: 40.0000 mg | ORAL_TABLET | Freq: Every day | ORAL | Status: DC

## 2015-02-04 MED ORDER — LACTATED RINGERS IV SOLN: INTRAVENOUS | Status: DC

## 2015-02-04 MED ORDER — PROPOFOL 10 MG/ML IV BOLUS
INTRAVENOUS | Status: AC
  Filled 2015-02-04: qty 20

## 2015-02-04 MED ORDER — URIBEL 118 MG PO CAPS: 1.0000 | ORAL_CAPSULE | Freq: Three times a day (TID) | ORAL | Status: DC | PRN

## 2015-02-04 MED ORDER — BELLADONNA ALKALOIDS-OPIUM 16.2-60 MG RE SUPP
RECTAL | Status: DC | PRN
  Administered 2015-02-04: 1 via RECTAL

## 2015-02-04 MED ORDER — KETOROLAC TROMETHAMINE 30 MG/ML IJ SOLN
INTRAMUSCULAR | Status: AC
  Filled 2015-02-04: qty 1

## 2015-02-04 MED ORDER — PROPOFOL 10 MG/ML IV BOLUS
INTRAVENOUS | Status: DC | PRN
  Administered 2015-02-04: 200 mg via INTRAVENOUS

## 2015-02-04 MED ORDER — LIDOCAINE HCL (CARDIAC) 20 MG/ML IV SOLN
INTRAVENOUS | Status: AC
Start: 2015-02-04 — End: 2015-02-04
  Filled 2015-02-04: qty 5

## 2015-02-04 MED ORDER — LISINOPRIL-HYDROCHLOROTHIAZIDE 20-12.5 MG PO TABS: ORAL_TABLET | ORAL | Status: DC

## 2015-02-04 MED ORDER — LIDOCAINE HCL (CARDIAC) 20 MG/ML IV SOLN
INTRAVENOUS | Status: DC | PRN
  Administered 2015-02-04: 100 mg via INTRAVENOUS

## 2015-02-04 MED ORDER — HYDROMORPHONE HCL 1 MG/ML IJ SOLN
1.0000 mg | Freq: Once | INTRAMUSCULAR | Status: AC
Start: 2015-02-04 — End: 2015-02-04
  Administered 2015-02-04: 1 mg via INTRAVENOUS
  Filled 2015-02-04: qty 1

## 2015-02-04 MED ORDER — EPHEDRINE SULFATE 50 MG/ML IJ SOLN
INTRAMUSCULAR | Status: DC | PRN
  Administered 2015-02-04: 10 mg via INTRAVENOUS

## 2015-02-04 MED ORDER — ONDANSETRON HCL 4 MG/2ML IJ SOLN
INTRAMUSCULAR | Status: AC
  Filled 2015-02-04: qty 2

## 2015-02-04 MED ORDER — FENTANYL CITRATE (PF) 100 MCG/2ML IJ SOLN: 25.0000 ug | INTRAMUSCULAR | Status: DC | PRN

## 2015-02-04 MED ORDER — DEXAMETHASONE SODIUM PHOSPHATE 10 MG/ML IJ SOLN
INTRAMUSCULAR | Status: DC | PRN
  Administered 2015-02-04: 10 mg via INTRAVENOUS

## 2015-02-04 MED ORDER — FENTANYL CITRATE (PF) 100 MCG/2ML IJ SOLN
INTRAMUSCULAR | Status: DC | PRN
  Administered 2015-02-04: 100 ug via INTRAVENOUS

## 2015-02-04 MED ORDER — KETOROLAC TROMETHAMINE 30 MG/ML IJ SOLN
INTRAMUSCULAR | Status: DC | PRN
  Administered 2015-02-04: 30 mg via INTRAVENOUS

## 2015-02-04 MED ORDER — CEFAZOLIN SODIUM-DEXTROSE 2-3 GM-% IV SOLR
2.0000 g | INTRAVENOUS | Status: AC
  Administered 2015-02-04: 2 g via INTRAVENOUS

## 2015-02-04 MED ORDER — CEFAZOLIN SODIUM-DEXTROSE 2-3 GM-% IV SOLR
INTRAVENOUS | Status: AC
  Filled 2015-02-04: qty 50

## 2015-02-04 SURGICAL SUPPLY — 18 items
BAG URINE DRAINAGE (UROLOGICAL SUPPLIES) ×3 IMPLANT
BASKET ZERO TIP NITINOL 2.4FR (BASKET) ×3 IMPLANT
CATH INTERMIT  6FR 70CM (CATHETERS) IMPLANT
CLOTH BEACON ORANGE TIMEOUT ST (SAFETY) ×3 IMPLANT
DRSG TEGADERM 2-3/8X2-3/4 SM (GAUZE/BANDAGES/DRESSINGS) ×3 IMPLANT
FIBER LASER FLEXIVA 1000 (UROLOGICAL SUPPLIES) IMPLANT
FIBER LASER FLEXIVA 200 (UROLOGICAL SUPPLIES) IMPLANT
FIBER LASER FLEXIVA 365 (UROLOGICAL SUPPLIES) ×3 IMPLANT
FIBER LASER FLEXIVA 550 (UROLOGICAL SUPPLIES) IMPLANT
FIBER LASER TRAC TIP (UROLOGICAL SUPPLIES) IMPLANT
GLOVE BIOGEL M STRL SZ7.5 (GLOVE) ×3 IMPLANT
GOWN STRL REUS W/TWL XL LVL3 (GOWN DISPOSABLE) IMPLANT
GUIDEWIRE .038 (WIRE) IMPLANT
GUIDEWIRE ANG ZIPWIRE 038X150 (WIRE) IMPLANT
GUIDEWIRE STR DUAL SENSOR (WIRE) ×6 IMPLANT
IV NS IRRIG 3000ML ARTHROMATIC (IV SOLUTION) ×3 IMPLANT
PACK CYSTO (CUSTOM PROCEDURE TRAY) ×3 IMPLANT
STENT URET 6FRX24 CONTOUR (STENTS) ×3 IMPLANT

## 2015-02-04 NOTE — Discharge Instructions (Signed)

## 2015-02-04 NOTE — ED Provider Notes (Signed)
CSN: [id number]     Arrival date & time 02/04/15  1559 History   First MD Initiated Contact with Patient 02/04/15 1637     Chief Complaint  Patient presents with  . Nephrolithiasis    sent by urgent care     (Consider location/radiation/quality/duration/timing/severity/associated sxs/prior Treatment) HPI Donald Zhang is a 59 y.o. male history of hyperlipidemia and hypertension, presents to emergency room complaining of right flank pain. Patient states his pain began 5 days ago. Pain radiates from the right flank into the right lower abdomen. He denies any associated nausea, vomiting, fever, chills, urinary symptoms. He went to [facility] emergency department 2 days ago, there he had an ultrasound which showed no evidence of hydronephrosis, he was discharged home with pain medications. Patient states today he went to see his primary care doctor who ordered an outpatient CT scan which showed a 9 mm stone at UVJ. He continues to have pain, he was sent here for further evaluation. He has been taking Norco at home for the pain. He has had little relief from the pain medications. He now states he feels constipated as well.  Past Medical History  Diagnosis Date  . Hyperlipidemia   . Hypertension    History reviewed. No pertinent past surgical history. Family History  Problem Relation Age of Onset  . Hypertension Mother   . Diabetes Father   . Diabetes Brother    History  Substance Use Topics  . Smoking status: Current Every Day Smoker  . Smokeless tobacco: Not on file  . Alcohol Use: 3.6 oz/week    6 Cans of beer per week    Review of Systems  Constitutional: Negative for fever and chills.  Respiratory: Negative for cough, chest tightness and shortness of breath.   Cardiovascular: Negative for chest pain, palpitations and leg swelling.  Gastrointestinal: Positive for abdominal pain. Negative for nausea, vomiting and abdominal distention.  Genitourinary: Negative for dysuria,  urgency, frequency and hematuria.  Musculoskeletal: Negative for myalgias, arthralgias, neck pain and neck stiffness.  Skin: Negative for rash.  Allergic/Immunologic: Negative for immunocompromised state.  Neurological: Negative for dizziness, weakness, light-headedness, numbness and headaches.  All other systems reviewed and are negative.     Allergies  Review of patient's allergies indicates no known allergies.  Home Medications   Prior to Admission medications   Medication Sig Start Date End Date Taking? Authorizing Provider  atorvastatin (LIPITOR) 40 MG tablet Take 1 tablet (40 mg total) by mouth daily. 02/04/15 02/04/16 Yes Darlyne Russian, MD  HYDROcodone-acetaminophen (NORCO/VICODIN) 5-325 MG per tablet Take 1-2 tablets by mouth every 6 (six) hours as needed. Patient taking differently: Take 1-2 tablets by mouth every 6 (six) hours as needed for moderate pain.  02/02/15  Yes Robynn Pane, MD  lisinopril-hydrochlorothiazide [facility]) 20-12.5 MG per tablet Take one daily for blood pressure 02/04/15  Yes Darlyne Russian, MD  triamcinolone cream (KENALOG) 0.1 % Apply 1 application topically 2 (two) times daily. Patient not taking: Reported on 02/04/2015 11/02/13   Posey Boyer, MD   BP 166/79 mmHg  Pulse 80  Temp(Src) 98.2 F (36.8 C) (Oral)  Resp 16  SpO2 97% Physical Exam  Constitutional: He is oriented to person, place, and time. He appears well-developed and well-nourished. No distress.  HENT:  Head: Normocephalic and atraumatic.  Eyes: Conjunctivae are normal.  Neck: Neck supple.  Cardiovascular: Normal rate, regular rhythm and normal heart sounds.   Pulmonary/Chest: Effort normal. No respiratory distress. He has no  wheezes. He has no rales.  Abdominal: Soft. Bowel sounds are normal. He exhibits no distension. There is no tenderness. There is no rebound and no guarding.  No CVA tenderness  Musculoskeletal: He exhibits no edema.  Neurological: He is alert and oriented  to person, place, and time.  Skin: Skin is warm and dry.  Nursing note and vitals reviewed.   ED Course  Procedures (including critical care time) Labs Review Labs Reviewed - No data to display  Imaging Review Ct Abdomen Pelvis Wo Contrast  02/04/2015   CLINICAL DATA:  Right lower quadrant pain and right flank pain, 1 week duration.  EXAM: CT ABDOMEN AND PELVIS WITHOUT CONTRAST  TECHNIQUE: Multidetector CT imaging of the abdomen and pelvis was performed following the standard protocol without IV contrast.  COMPARISON:  Radiography same day  FINDINGS: Lung bases are clear except for pleural and parenchymal scarring at the right base laterally. The liver has a normal appearance without contrast. No calcified gallstones. The spleen is normal. The pancreas is normal. Within the right adrenal gland, there is an 18 mm low-density mass likely to represent an adenoma. The left adrenal gland is normal. The left kidney is normal except for 2 with 3 punctate stones in the lower pole without evidence of passing stone or obstruction. The right kidney is atrophic compared to the left. There is mild fullness of the renal collecting system and ureter. There is an 8-9 mm stone it is either in the right ureter about to pass into the bladder or just passed into the bladder. There is mild edema in the fat around the right kidney consistent with pyelo sinus extravasation. No residual stone in the right kidney. The aorta and IVC are normal. The appendix is normal. No other acute bowel finding. Prostate gland and seminal vesicles are unremarkable. Ordinary degenerative changes affect the spine.  IMPRESSION: 9 mm stone that has either just passed into the bladder or is about to pass through the right UVJ.  The right kidney shows atrophy relative to the left. Mild surrounding edema consistent with pyelo sinus extravasation related to the recent stone passage.  18 mm low-density lesion of the right adrenal gland likely to  represent a benign adrenal adenoma.  Few punctate stones in the lower pole of the left kidney, incidental today.  Normal appendix   Electronically Signed   By: Nelson Chimes M.D.   On: 02/04/2015 13:51   Dg Abd Acute W/chest  02/04/2015   CLINICAL DATA:  Right lower quadrant abdominal pain.  EXAM: DG ABDOMEN ACUTE W/ 1V CHEST  COMPARISON:  Two-view chest x-ray 11/02/2013 and 08/23/2009  FINDINGS: Heart size is normal. Lungs are clear. Elevation of the right hemidiaphragm is chronic.  Moderate stool is present in the ascending colon without obstruction. The bowel gas pattern is otherwise normal. Degenerative changes are evident within the lumbar spine and bilateral hips, right greater than left.  IMPRESSION: 1. No acute cardiopulmonary disease. 2. Chronic elevation of the right hemidiaphragm and blunting of the CP angle. 3. Moderate stool within the ascending colon. 4. Degenerative changes in the lower lumbar spine and right greater than left hips.   Electronically Signed   By: San Morelle M.D.   On: 02/04/2015 10:32     EKG Interpretation None      MDM   Final diagnoses:  Nephrolithiasis    Patient with 9 mm UVJ stone, sent here from urgent care. Pain for 5 days. Will call urology. UA at urgent  care normal appearing. Creatinine bumped to 1.7 from baseline of about 1.2. Normal WBC.   5:18 PM Spoke with urology, will come by and see him.   Patient will be admitted by urology. Filed Vitals:   02/04/15 2141 02/04/15 2145 02/04/15 2200 02/04/15 2215  BP: 156/82 147/82 154/82 144/95  Pulse: 80 73 79 79  Temp: 98.2 F (36.8 C)   98.2 F (36.8 C)  TempSrc:      Resp: 18 17 18 11   SpO2: 100% 100% 98% 98%         Jeannett Senior, PA-C 02/05/15 0039  Varney Biles, MD 02/05/15 1517

## 2015-02-04 NOTE — ED Notes (Signed)
Patient sent by Urgent Care. MD called-patient had CT scan done today showing right sided kidney stones. Having right lower flank pain with dysuria. Has not had pain medication since 0100 this morning. Denies N/V/D/fevers. No other c/c.

## 2015-02-04 NOTE — Transfer of Care (Signed)
Immediate Anesthesia Transfer of Care Note  Patient: Donald Zhang  Procedure(s) Performed: Procedure(s): CYSTOSCOPY/RETROGRADE/URETEROSCOPY/STONE EXTRACTION WITH BASKET/STENT PLACEMENT RIGHT (Right) HOLMIUM LASER APPLICATION (Right)  Patient Location: PACU  Anesthesia Type:General  Level of Consciousness: awake, alert  and oriented  Airway & Oxygen Therapy: Patient Spontanous Breathing and Patient connected to face mask oxygen  Post-op Assessment: Report given to RN and Post -op Vital signs reviewed and stable  Post vital signs: Reviewed and stable  Last Vitals:  Filed Vitals:   02/04/15 1933  BP: 127/8  Pulse: 57  Temp: 36.9 C  Resp: 18    Complications: No apparent anesthesia complications

## 2015-02-04 NOTE — Progress Notes (Signed)
Subjective:    Patient ID: Donald Zhang, male    DOB: [birth year], 59 y.o.   MRN: [id number]  HPI scribed by Cheryll Cockayne for Dr. Everlene Farrier in his presence Room 7  Patient was seen in ED x 2 days ago. He is having pain Right side/flank. Hurts to take a breath. He states they said it was not his appendix. He is having severe pain in R side/flank. He has been taking hydrocodone that was prescribed by the ED and that alleviates the pain a little but not enough. Patient's pain is in the right flank. He also feels some discomfort in his abdomen. He denies fever. He denies vomiting. He is also requesting refills of his blood pressure and lipid medication.     Review of Systems     Objective:   Physical Exam  Constitutional: He appears well-developed.  Eyes: Pupils are equal, round, and reactive to light.  Neck: Normal range of motion. No thyromegaly present.  Cardiovascular: Normal rate.   Pulmonary/Chest: Effort normal and breath sounds normal.  Abdominal:  Flat there are bowel sounds present. There is tenderness right mid and lower abdomen without rebound. There is no tenderness in the right inguinal area. There is no specific tenderness over the right flank.  [facility] reading (PRIMARY) by  Dr.Daub there is a large stool burden on the right. There may be some mild blunting of the right costophrenic angle. There is arthritic change of the right hip. There is no free air there is no evidence of obstruction.  Results for orders placed or performed in visit on 02/04/15  POCT CBC  Result Value Ref Range   WBC 6.5 4.6 - 10.2 K/uL   Lymph, poc 1.7 0.6 - 3.4   POC LYMPH PERCENT 25.8 10 - 50 %L   MID (cbc) 0.3 0 - 0.9   POC MID % 4.2 0 - 12 %M   POC Granulocyte 4.6 2 - 6.9   Granulocyte percent 70.0 37 - 80 %G   RBC 5.04 4.69 - 6.13 M/uL   Hemoglobin 14.7 14.1 - 18.1 g/dL   HCT, POC 45.3 43.5 - 53.7 %   MCV 89.9 80 - 97 fL   MCH, POC 29.2 27 - 31.2 pg   MCHC 32.5 31.8 - 35.4 g/dL   RDW,  POC 13.3 %   Platelet Count, POC 206 142 - 424 K/uL   MPV 7.5 0 - 99.8 fL  POCT urinalysis dipstick  Result Value Ref Range   Color, UA yellow    Clarity, UA clear    Glucose, UA neg    Bilirubin, UA neg    Ketones, UA neg    Spec Grav, UA 1.020    Blood, UA neg    pH, UA 5.5    Protein, UA neg    Urobilinogen, UA 0.2    Nitrite, UA neg    Leukocytes, UA Negative   POCT UA - Microscopic Only  Result Value Ref Range   WBC, Ur, HPF, POC 1-4    RBC, urine, microscopic 0-1    Bacteria, U Microscopic neg    Mucus, UA neg    Epithelial cells, urine per micros neg    Crystals, Ur, HPF, POC neg    Casts, Ur, LPF, POC neg    Yeast, UA neg          Assessment & Plan:  Patient seen with significant flank and abdominal pain. CT abdomen pelvis was done. This revealed a 9  mm stone at the right UVJ. I called Dr. Risa Grill he is aware the patient being sent to the emergency room at [facility] long for further evaluation. I called [facility] they are aware patient coming.I personally performed the services described in this documentation, which was scribed in my presence. The recorded information has been reviewed and is accurate.  Nena Jordan, MD
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
## 2015-02-04 NOTE — Anesthesia Postprocedure Evaluation (Signed)
  Anesthesia Post-op Note  Patient: Donald Zhang  Procedure(s) Performed: Procedure(s) (LRB): CYSTOSCOPY/RETROGRADE/URETEROSCOPY/STONE EXTRACTION WITH BASKET/STENT PLACEMENT RIGHT (Right) HOLMIUM LASER APPLICATION (Right)  Patient Location: PACU  Anesthesia Type: General  Level of Consciousness: awake and alert   Airway and Oxygen Therapy: Patient Spontanous Breathing  Post-op Pain: mild  Post-op Assessment: Post-op Vital signs reviewed, Patient's Cardiovascular Status Stable, Respiratory Function Stable, Patent Airway and No signs of Nausea or vomiting  Last Vitals:  Filed Vitals:   02/04/15 2141  BP: 156/82  Pulse: 80  Temp: 36.8 C  Resp: 18    Post-op Vital Signs: stable   Complications: No apparent anesthesia complications

## 2015-02-04 NOTE — Anesthesia Preprocedure Evaluation (Signed)
Anesthesia Evaluation  Patient identified by MRN, date of birth, ID band Patient awake    Reviewed: Allergy & Precautions, NPO status , Patient's Chart, lab work & pertinent test results  History of Anesthesia Complications Negative for: history of anesthetic complications  Airway Mallampati: II  TM Distance: >3 FB Neck ROM: Full    Dental no notable dental hx. (+) Dental Advisory Given   Pulmonary Current Smoker,  breath sounds clear to auscultation  Pulmonary exam normal       Cardiovascular hypertension, Pt. on medications Normal cardiovascular examRhythm:Regular Rate:Normal     Neuro/Psych negative neurological ROS  negative psych ROS   GI/Hepatic negative GI ROS, Neg liver ROS,   Endo/Other  negative endocrine ROS  Renal/GU negative Renal ROS  negative genitourinary   Musculoskeletal negative musculoskeletal ROS (+)   Abdominal   Peds negative pediatric ROS (+)  Hematology negative hematology ROS (+)   Anesthesia Other Findings   Reproductive/Obstetrics negative OB ROS                             Anesthesia Physical Anesthesia Plan  ASA: II  Anesthesia Plan: General   Post-op Pain Management:    Induction: Intravenous  Airway Management Planned: Oral ETT  Additional Equipment:   Intra-op Plan:   Post-operative Plan: Extubation in OR  Informed Consent: I have reviewed the patients History and Physical, chart, labs and discussed the procedure including the risks, benefits and alternatives for the proposed anesthesia with the patient or authorized representative who has indicated his/her understanding and acceptance.   Dental advisory given  Plan Discussed with: CRNA  Anesthesia Plan Comments:         Anesthesia Quick Evaluation

## 2015-02-04 NOTE — ED Notes (Signed)
Bed: WA25 Expected date:  Expected time:  Means of arrival:  Comments: Triage 2 

## 2015-02-04 NOTE — Anesthesia Procedure Notes (Addendum)
Procedure Name: Intubation Date/Time: 02/04/2015 8:41 PM Performed by: Sharlette Dense Pre-anesthesia Checklist: Patient identified, Emergency Drugs available, Suction available and Patient being monitored Patient Re-evaluated:Patient Re-evaluated prior to inductionOxygen Delivery Method: Circle System Utilized Preoxygenation: Pre-oxygenation with 100% oxygen Intubation Type: IV induction Ventilation: Mask ventilation without difficulty Laryngoscope Size: Mac and 4 Grade View: Grade II Tube type: Oral Tube size: 8.0 mm Number of attempts: 1 Airway Equipment and Method: Stylet and Oral airway Placement Confirmation: ETT inserted through vocal cords under direct vision,  positive ETCO2 and breath sounds checked- equal and bilateral Secured at: 22 cm Tube secured with: Tape Dental Injury: Teeth and Oropharynx as per pre-operative assessment

## 2015-02-04 NOTE — ED Notes (Signed)
Patient expected at ED, ct scan has been done and shows a 9 mm stone at junction of bladder, [facility] already notified.

## 2015-02-04 NOTE — ED Notes (Signed)
MD at bedside.  Urology

## 2015-02-04 NOTE — Op Note (Signed)
Preoperative diagnosis: 9 mm right distal obstructive ureteral calculus Postoperative diagnosis: Same  Procedure: Cystoscopy, ureteroscopy, holmium laser lithotripsy, incision of ureteral orifice, basketing of stone, JJ stent placement   Surgeon: Bernestine Amass M.D.  Anesthesia: Gen.  Indications: 59 year old male without prior history of nephrolithiasis. He presented approximately 3 days ago with several days of right-sided lower abdominal discomfort. Initial assessment in the ER consisted of an ultrasound and KUB which are relatively unremarkable. Today the patient returned with discomfort and had a CT scan which revealed an obstructing 9 mm right stone at the ureterovesical junction. We felt the stone had a very low likelihood of spontaneous passage. The patient requested intervention secondary to ongoing discomfort. Risks benefits discussed with him.     Technique and findings: Patient was brought the operating room where he had successful induction general anesthesia. He was placed in lithotomy position prepped and draped in usual manner. PAS compression boots were utilized and the patient received perioperative antibiotics. Appropriate surgical timeout was performed. Cystoscopically the patient had a unremarkable anterior urethra. Prostatic urethra was also nonobstructing. Within the bladder there was marked edema and erythema of the right ureteral orifice. A small portion of the stone could be seen crowning. Initial attempts to place a guidewire beyond the stone were unsuccessful due to the very large nature of the stone in its impacted situation. Ureteroscope was used but with that as well we were unable to manipulate beyond the stone. For that reason a cystoscope was reinserted. Holmium laser lithotripter was utilized at 0.8 J 5 Hz. The stone was partially fragmented. We then went ahead and made a meatotomy of the ureteral orifice. This allowed for greater exposure of the stone as we continued to  treat the stone it suddenly popped out of the ureter. There was marked proximal dilation. A guidewire was then placed to the right renal pelvis. An open-ended stent was utilized and retrograde pyelography was performed with fluoroscopic interpretation. The more proximal ureter was markedly dilated as was the pelvicalyceal system. Over the guidewire we then placed a 6 French 24 cm double-J stent with a dangle string.   The edges of the ureteral orifice were bleeding fairly vigorously and therefore we put a Bugbee electrode in. We changed from a saline irrigant to a sterile water irrigant. Using a Bugbee electrode we cauterized some of the edges of the ureteral orifice. The stent continued to be in good position. The stone was evacuated from the bladder measuring about 9-10 mm. The bladder was then drained. The dangle string was secured to the top of the patient's penis and lidocaine jelly was instilled as was a B&O suppository. He was brought to recovery room stable condition having had no obvious complications or problems.

## 2015-02-07 ENCOUNTER — Encounter (HOSPITAL_COMMUNITY): Payer: Self-pay | Admitting: Urology

## 2016-04-20 ENCOUNTER — Other Ambulatory Visit: Payer: Self-pay | Admitting: Emergency Medicine

## 2016-04-20 DIAGNOSIS — I10 Essential (primary) hypertension: Secondary | ICD-10-CM

## 2016-05-21 ENCOUNTER — Ambulatory Visit (INDEPENDENT_AMBULATORY_CARE_PROVIDER_SITE_OTHER): Payer: BLUE CROSS/BLUE SHIELD | Admitting: Family Medicine

## 2016-05-21 ENCOUNTER — Encounter: Payer: Self-pay | Admitting: Family Medicine

## 2016-05-21 VITALS — BP 140/88 | HR 61 | Temp 98.3°F | Resp 18 | Ht 69.0 in | Wt 203.0 lb

## 2016-05-21 DIAGNOSIS — I10 Essential (primary) hypertension: Secondary | ICD-10-CM

## 2016-05-21 DIAGNOSIS — Z79899 Other long term (current) drug therapy: Secondary | ICD-10-CM

## 2016-05-21 DIAGNOSIS — R9431 Abnormal electrocardiogram [ECG] [EKG]: Secondary | ICD-10-CM | POA: Diagnosis not present

## 2016-05-21 DIAGNOSIS — N183 Chronic kidney disease, stage 3 unspecified: Secondary | ICD-10-CM

## 2016-05-21 DIAGNOSIS — F17200 Nicotine dependence, unspecified, uncomplicated: Secondary | ICD-10-CM | POA: Diagnosis not present

## 2016-05-21 DIAGNOSIS — R011 Cardiac murmur, unspecified: Secondary | ICD-10-CM

## 2016-05-21 DIAGNOSIS — E785 Hyperlipidemia, unspecified: Secondary | ICD-10-CM | POA: Diagnosis not present

## 2016-05-21 LAB — COMPREHENSIVE METABOLIC PANEL
ALT: 15 U/L (ref 9–46)
AST: 15 U/L (ref 10–35)
Albumin: 4.2 g/dL (ref 3.6–5.1)
Alkaline Phosphatase: 69 U/L (ref 40–115)
BUN: 17 mg/dL (ref 7–25)
CHLORIDE: 108 mmol/L (ref 98–110)
CO2: 27 mmol/L (ref 20–31)
Calcium: 9.1 mg/dL (ref 8.6–10.3)
Creat: 1.55 mg/dL — ABNORMAL HIGH (ref 0.70–1.33)
GLUCOSE: 92 mg/dL (ref 65–99)
POTASSIUM: 4.4 mmol/L (ref 3.5–5.3)
Sodium: 141 mmol/L (ref 135–146)
Total Bilirubin: 0.4 mg/dL (ref 0.2–1.2)
Total Protein: 6.7 g/dL (ref 6.1–8.1)

## 2016-05-21 LAB — LIPID PANEL
CHOL/HDL RATIO: 4.1 ratio (ref <=5.0)
Cholesterol: 139 mg/dL (ref 125–200)
HDL: 34 mg/dL — ABNORMAL LOW (ref >=40)
LDL Cholesterol: 78 mg/dL (ref <130)
Triglycerides: 135 mg/dL (ref <150)
VLDL: 27 mg/dL (ref <30)

## 2016-05-21 LAB — MICROALBUMIN / CREATININE URINE RATIO
Creatinine, Urine: 86 mg/dL (ref 20–370)
Microalb Creat Ratio: 5 mcg/mg creat (ref <30)
Microalb, Ur: 0.4 mg/dL

## 2016-05-21 LAB — POCT URINALYSIS DIP (MANUAL ENTRY)
Bilirubin, UA: NEGATIVE
GLUCOSE UA: NEGATIVE
Ketones, POC UA: NEGATIVE
Leukocytes, UA: NEGATIVE
Nitrite, UA: NEGATIVE
Protein Ur, POC: NEGATIVE
RBC UA: NEGATIVE
SPEC GRAV UA: 1.015
UROBILINOGEN UA: 0.2
pH, UA: 6.5

## 2016-05-21 MED ORDER — LISINOPRIL-HYDROCHLOROTHIAZIDE 20-12.5 MG PO TABS: ORAL_TABLET | ORAL | 3 refills | Status: DC

## 2016-05-21 MED ORDER — ATORVASTATIN CALCIUM 40 MG PO TABS: 40.0000 mg | ORAL_TABLET | Freq: Every day | ORAL | 3 refills | Status: DC

## 2016-05-21 NOTE — Progress Notes (Signed)
By signing my name below, I, Mesha Guinyard, attest that this documentation has been prepared under the direction and in the presence of Delman Cheadle.  Electronically Signed: Verlee Monte, Medical Scribe. 05/21/16. 8:25 AM.  Subjective:    Patient ID: Donald Zhang, male    DOB: [birth year], 60 y.o.   MRN: [id number]  Medication Refill  Pertinent negatives include no chest pain, chills, fever, headaches or weakness.   Chief Complaint  Patient presents with  . Medication Refill    Lisinopril-hydrochorothiazide    HPI Comments: Donald Zhang is a 60 y.o. male with a PMHx of HTN who presents to the [facility] for medication refill on lisinopril-HCTZ.  HTN: Last labs 1 yr prior shows eGFR 50. Pt occasionally checks his bp but recently it's been high since he's been off his bp off for a week. Pt has been drinking apple cider vinegar and water while avoiding salt in his diet for relief to his symptoms. Pt takes a baby ASA daily.   HPL: Pt states he is not taking lipitor due to his concern over its side effects.  However, he states that he is taking a rx cholesterol medicine one time a day of unknown name in addition to his BP med- the only thing he has been rxed recently is the atorvastatin so I am wondering if pt did not realizde that these are the same med.   Arthralgias: Pt occasionally takes Aleve when he has left knee pain, but not often.   Pt denies experiencing any side effects while on his medictions such as cough, polyuria, visual floaters, chest pain, and HAs.  Patient Active Problem List   Diagnosis Date Noted  . Ureteral calculus 02/04/2015  . HTN (hypertension) 01/28/2012  . Tobacco dependence 01/28/2012  . Hypertension 12/19/2011  . Dyslipidemia 12/19/2011   Past Medical History:  Diagnosis Date  . Hyperlipidemia   . Hypertension    Past Surgical History:  Procedure Laterality Date  . CYSTOSCOPY/RETROGRADE/URETEROSCOPY/STONE EXTRACTION  WITH BASKET Right 02/04/2015   Procedure: CYSTOSCOPY/RETROGRADE/URETEROSCOPY/STONE EXTRACTION WITH BASKET/STENT PLACEMENT RIGHT;  Surgeon: Rana Snare, MD;  Location: [facility];  Service: Urology;  Laterality: Right;  . HOLMIUM LASER APPLICATION Right 12/25/4740   Procedure: HOLMIUM LASER APPLICATION;  Surgeon: Rana Snare, MD;  Location: [facility];  Service: Urology;  Laterality: Right;   No Known Allergies Prior to Admission medications   Medication Sig Start Date End Date Taking? Authorizing Provider  HYDROcodone-acetaminophen (NORCO/VICODIN) 5-325 MG per tablet Take 1-2 tablets by mouth every 6 (six) hours as needed. Patient taking differently: Take 1-2 tablets by mouth every 6 (six) hours as needed for moderate pain.  02/02/15  Yes Robynn Pane, MD  lisinopril-hydrochlorothiazide (PRINZIDE,ZESTORETIC) 20-12.5 MG tablet TAKE ONE TABLET BY MOUTH ONCE DAILY FOR BLOOD PRESSURE 04/23/16  Yes Wardell Honour, MD  Meth-Hyo-M Barnett Hatter Phos-Ph Sal (URIBEL) 118 MG CAPS Take 1 capsule (118 mg total) by mouth 3 (three) times daily as needed. 02/04/15  Yes Rana Snare, MD  triamcinolone cream (KENALOG) 0.1 % Apply 1 application topically 2 (two) times daily. 11/02/13  Yes Posey Boyer, MD  atorvastatin (LIPITOR) 40 MG tablet Take 1 tablet (40 mg total) by mouth daily. 02/04/15 02/04/16  Darlyne Russian, MD   Social History   Social History  . Marital status: Married    Spouse name: N/A  . Number of children: N/A  . Years of education: N/A   Occupational History  . Not on file.  Social History Main Topics  . Smoking status: Current Every Day Smoker  . Smokeless tobacco: Never Used  . Alcohol use 3.6 oz/week    6 Cans of beer per week  . Drug use: No  . Sexual activity: Yes   Other Topics Concern  . Not on file   Social History Narrative  . No narrative on file   Depression screen [facility] 2/9 05/21/2016 02/04/2015  Decreased Interest 0 1  Down, Depressed, Hopeless 0 0  PHQ - 2 Score 0 1   Review of  Systems  Constitutional: Negative for chills and fever.  Eyes: Negative for visual disturbance.  Respiratory: Negative for chest tightness and shortness of breath.   Cardiovascular: Negative for chest pain, palpitations and leg swelling.  Neurological: Negative for dizziness, syncope, facial asymmetry, weakness, light-headedness and headaches.   Objective:  Physical Exam  Constitutional: He appears well-developed and well-nourished. No distress.  HENT:  Head: Normocephalic and atraumatic.  Eyes: Conjunctivae are normal.  Neck: Neck supple. No thyromegaly present.  Cardiovascular: Normal rate.  An irregular rhythm present.  Murmur heard.  Systolic (left lower sternum border) murmur is present with a grade of 2/6  Irregular rhythm and regular rate in a bigeminy pattern  Pulmonary/Chest: Effort normal and breath sounds normal. No respiratory distress. He has no wheezes. He has no rales.  Neurological: He is alert.  Skin: Skin is warm and dry.  Psychiatric: He has a normal mood and affect. His behavior is normal.  Nursing note and vitals reviewed.  BP 140/88 (BP Location: Right Arm, Patient Position: Sitting, Cuff Size: Small)   Pulse 61   Temp 98.3 F (36.8 C) (Oral)   Resp 18   Ht 5' 9"  (1.753 m)   Wt 203 lb (92.1 kg)   SpO2 98%   BMI 29.98 kg/m    EKG Reading: EKG shows irregular with frequent bigeminy PACs with bradycardia. Persistent S-waves in lateral chest lead. Flipped T-waves in 2, 3 and AVF of no significant change from EKG from 11/02/13  Assessment & Plan:   1. Essential hypertension - has been off of bp med x 1 wk; resume, recheck in sev mos  2. Dyslipidemia   3. Tobacco dependence - did not discuss today - do so at f/u, does qualify for screening lung CT  4. Undiagnosed cardiac murmurs - new, suspect physiologic due to fasting/dehydration - asymptomatic so recheck at next OV and refer for echo if persists  5. Chronic kidney disease, stage 3 - reviewed w/ pt today,  avoid nsaid use and salt in food  6. Medication management   7. Hyperlipidemia - presume pt is on atorvastatin - cont  8. Nonspecific abnormal electrocardiogram (ECG) (EKG)    Pt will make an appointment for a CPE Orders Placed This Encounter  Procedures  . Comprehensive metabolic panel    Order Specific Question:   Has the patient fasted?    Answer:   Yes  . Lipid panel    Order Specific Question:   Has the patient fasted?    Answer:   Yes  . Microalbumin/Creatinine Ratio, Urine  . POCT urinalysis dipstick  . EKG 12-Lead    Meds ordered this encounter  Medications  . lisinopril-hydrochlorothiazide (PRINZIDE,ZESTORETIC) 20-12.5 MG tablet    Sig: TAKE ONE TABLET BY MOUTH ONCE DAILY FOR BLOOD PRESSURE    Dispense:  90 tablet    Refill:  3  . atorvastatin (LIPITOR) 40 MG tablet    Sig: Take 1 tablet (  40 mg total) by mouth daily.    Dispense:  90 tablet    Refill:  3    I personally performed the services described in this documentation, which was scribed in my presence. The recorded information has been reviewed and considered, and addended by me as needed.   Delman Cheadle, M.D.  [facility] [address] [place], [place] [address] [phone] phone [phone] fax  05/21/16 11:46 AM

## 2016-05-21 NOTE — Patient Instructions (Addendum)
Please come back to see [place] in 3 to 4 months for your full physical so we can recheck your heart murmur.  Make sure you take your blood pressure medications prior to that visit.   IF you received an x-ray today, you will receive an invoice from [facility]. Please contact [facility] at [phone] with questions or concerns regarding your invoice.   IF you received labwork today, you will receive an invoice from [employer]. Please contact [facility] at [phone] with questions or concerns regarding your invoice.   Our billing staff will not be able to assist you with questions regarding bills from these companies.  You will be contacted with the lab results as soon as they are available. The fastest way to get your results is to activate your My Chart account. Instructions are located on the last page of this paperwork. If you have not heard from [place] regarding the results in 2 weeks, please contact this office.    Heart Murmur A heart murmur is an extra sound heard by your health care provider when listening to your heart with a device called a stethoscope. The sound comes from turbulence when blood flows through the heart and may be a "hum" or "whoosh" sound heard when the heart beats. There are two types of heart murmurs:  Innocent murmurs. Most people with this type of heart murmur do not have a heart problem. Many children have innocent heart murmurs. Your health care provider may suggest some basic testing to know whether your murmur is an innocent murmur. If an innocent heart murmur is found, there is no need for further tests or treatment and no need to restrict activities or stop playing sports.  Abnormal murmurs. These types of murmurs can occur in children and adults. In children, abnormal heart murmurs are typically caused from heart defects that are present at birth (congenital). In adults, abnormal murmurs are usually from heart valve  problems caused by disease, infection, or aging. CAUSES  Normally, these valves open to let blood flow through or out of your heart and then shut to keep it from flowing backward. If they do not work properly, you could have:  Regurgitation--When blood leaks back through the valve in the wrong direction.  Mitral valve prolapse--When the mitral valve of the heart has a loose flap and does not close tightly.  Stenosis--When the valve does not open enough and blocks blood flow. SIGNS AND SYMPTOMS  Innocent murmurs do not cause symptoms, and many people with abnormal murmurs may or may not have symptoms. If symptoms do develop, they may include:  Shortness of breath.  Blue coloring of the skin, especially on the fingertips.  Chest pain.  Palpitations, or feeling a fluttering or skipped heartbeat.  Fainting.  Persistent cough.  Getting tired much faster than expected. DIAGNOSIS  A heart murmur might be heard during a sports physical or during any type of examination. When a murmur is heard, it may suggest a possible problem. When this happens, your health care provider may ask you to see a heart specialist (cardiologist). You may also be asked to have one or more heart tests. In these cases, testing may vary depending on what your health care provider heard. Tests for a heart murmur may include:  Electrocardiogram.  Echocardiogram.  MRI. For children and adults who have an abnormal heart murmur and want to play sports, it is important to complete testing, review test results, and receive recommendations from your health  care provider. If heart disease is present, it may not be safe to play. TREATMENT  Innocent murmurs require no treatment or activity restriction. If an abnormal murmur represents a problem with the heart, treatment will depend on the exact nature of the problem. In these cases, medicine or surgery may be needed to treat the problem. HOME CARE INSTRUCTIONS If you want  to participate in sports or other types of strenuous physical activity, it is important to discuss this first with your health care provider. If the murmur represents a problem with the heart and you choose to participate in sports, there is a small chance that a serious problem (including sudden death) could result.  SEEK MEDICAL CARE IF:   You feel that your symptoms are slowly worsening.  You develop any new symptoms that cause concern.  You feel that you are having side effects from any medicines prescribed. SEEK IMMEDIATE MEDICAL CARE IF:   You develop chest pain.  You have shortness of breath.  You notice that your heart beats irregularly often enough to cause you to worry.  You have fainting spells.  Your symptoms suddenly get worse.   This information is not intended to replace advice given to you by your health care provider. Make sure you discuss any questions you have with your health care provider.   Document Released: 10/18/2004 Document Revised: 10/01/2014 Document Reviewed: 05/18/2013 Elsevier Interactive Patient Education 2016 [place]. Heart-Healthy Eating Plan Many factors influence your heart health, including eating and exercise habits. Heart (coronary) risk increases with abnormal blood fat (lipid) levels. Heart-healthy meal planning includes limiting unhealthy fats, increasing healthy fats, and making other small dietary changes. This includes maintaining a healthy body weight to help keep lipid levels within a normal range. WHAT IS MY PLAN?  Your health care provider recommends that you:  Get no more than _________% of the total calories in your daily diet from fat.  Limit your intake of saturated fat to less than _________% of your total calories each day.  Limit the amount of cholesterol in your diet to less than _________ mg per day. WHAT TYPES OF FAT SHOULD I CHOOSE?  Choose healthy fats more often. Choose monounsaturated and polyunsaturated fats,  such as olive oil and canola oil, flaxseeds, walnuts, almonds, and seeds.  Eat more omega-3 fats. Good choices include salmon, mackerel, sardines, tuna, flaxseed oil, and ground flaxseeds. Aim to eat fish at least two times each week.  Limit saturated fats. Saturated fats are primarily found in animal products, such as meats, butter, and cream. Plant sources of saturated fats include palm oil, palm kernel oil, and coconut oil.  Avoid foods with partially hydrogenated oils in them. These contain trans fats. Examples of foods that contain trans fats are stick margarine, some tub margarines, cookies, crackers, and other baked goods. WHAT GENERAL GUIDELINES DO I NEED TO FOLLOW?  Check food labels carefully to identify foods with trans fats or high amounts of saturated fat.  Fill one half of your plate with vegetables and green salads. Eat 4-5 servings of vegetables per day. A serving of vegetables equals 1 cup of raw leafy vegetables,  cup of raw or cooked cut-up vegetables, or  cup of vegetable juice.  Fill one fourth of your plate with whole grains. Look for the word "whole" as the first word in the ingredient list.  Fill one fourth of your plate with lean protein foods.  Eat 4-5 servings of fruit per day. A serving of fruit  equals one medium whole fruit,  cup of dried fruit,  cup of fresh, frozen, or canned fruit, or  cup of 100% fruit juice.  Eat more foods that contain soluble fiber. Examples of foods that contain this type of fiber are apples, broccoli, carrots, beans, peas, and barley. Aim to get 20-30 g of fiber per day.  Eat more home-cooked food and less restaurant, buffet, and fast food.  Limit or avoid alcohol.  Limit foods that are high in starch and sugar.  Avoid fried foods.  Cook foods by using methods other than frying. Baking, boiling, grilling, and broiling are all great options. Other fat-reducing suggestions include:  Removing the skin from poultry.  Removing  all visible fats from meats.  Skimming the fat off of stews, soups, and gravies before serving them.  Steaming vegetables in water or broth.  Lose weight if you are overweight. Losing just 5-10% of your initial body weight can help your overall health and prevent diseases such as diabetes and heart disease.  Increase your consumption of nuts, legumes, and seeds to 4-5 servings per week. One serving of dried beans or legumes equals  cup after being cooked, one serving of nuts equals 1 ounces, and one serving of seeds equals  ounce or 1 tablespoon.  You may need to monitor your salt (sodium) intake, especially if you have high blood pressure. Talk with your health care provider or dietitian to get more information about reducing sodium. WHAT FOODS CAN I EAT? Grains Breads, including [place], white, pita, wheat, raisin, rye, oatmeal, and [place]. Tortillas that are neither fried nor made with lard or trans fat. Low-fat rolls, including hotdog and hamburger buns and English muffins. Biscuits. Muffins. Waffles. Pancakes. Light popcorn. Whole-grain cereals. Flatbread. Melba toast. Pretzels. Breadsticks. Rusks. Low-fat snacks and crackers, including oyster, saltine, matzo, graham, animal, and rye. Rice and pasta, including brown rice and those that are made with whole wheat. Vegetables All vegetables. Fruits All fruits, but limit coconut. Meats and Other Protein Sources Lean, well-trimmed beef, veal, pork, and lamb. Chicken and [place] without skin. All fish and shellfish. Wild duck, rabbit, pheasant, and venison. Egg whites or low-cholesterol egg substitutes. Dried beans, peas, lentils, and tofu.Seeds and most nuts. Dairy Low-fat or nonfat cheeses, including ricotta, string, and mozzarella. Skim or 1% milk that is liquid, powdered, or evaporated. Buttermilk that is made with low-fat milk. Nonfat or low-fat yogurt. Beverages Mineral water. Diet carbonated beverages. Sweets and Desserts Sherbets  and fruit ices. Honey, jam, marmalade, jelly, and syrups. Meringues and gelatins. Pure sugar candy, such as hard candy, jelly beans, gumdrops, mints, marshmallows, and small amounts of dark chocolate. [employer] Eat all sweets and desserts in moderation. Fats and Oils Nonhydrogenated (trans-free) margarines. Vegetable oils, including soybean, sesame, sunflower, olive, peanut, safflower, corn, canola, and cottonseed. Salad dressings or mayonnaise that are made with a vegetable oil. Limit added fats and oils that you use for cooking, baking, salads, and as spreads. Other Cocoa powder. Coffee and tea. All seasonings and condiments. The items listed above may not be a complete list of recommended foods or beverages. Contact your dietitian for more options. WHAT FOODS ARE NOT RECOMMENDED? Grains Breads that are made with saturated or trans fats, oils, or whole milk. Croissants. Butter rolls. Cheese breads. Sweet rolls. Donuts. Buttered popcorn. Chow mein noodles. High-fat crackers, such as cheese or butter crackers. Meats and Other Protein Sources Fatty meats, such as hotdogs, short ribs, sausage, spareribs, bacon, ribeye roast or steak, and  mutton. High-fat deli meats, such as salami and bologna. Caviar. Domestic duck and goose. Organ meats, such as kidney, liver, sweetbreads, brains, gizzard, chitterlings, and heart. Dairy Cream, sour cream, cream cheese, and creamed cottage cheese. Whole milk cheeses, including blue (bleu), Monte[place] Jack, [place], [place], American, [place], Swiss, [place], [place], and [place]. Whole or 2% milk that is liquid, evaporated, or condensed. Whole buttermilk. Cream sauce or high-fat cheese sauce. Yogurt that is made from whole milk. Beverages Regular sodas and drinks with added sugar. Sweets and Desserts Frosting. Pudding. Cookies. Cakes other than angel food cake. Candy that has milk chocolate or white chocolate, hydrogenated fat, butter, coconut, or unknown  ingredients. Buttered syrups. Full-fat ice cream or ice cream drinks. Fats and Oils Gravy that has suet, meat fat, or shortening. Cocoa butter, hydrogenated oils, palm oil, coconut oil, palm kernel oil. These can often be found in baked products, candy, fried foods, nondairy creamers, and whipped toppings. Solid fats and shortenings, including bacon fat, salt pork, lard, and butter. Nondairy cream substitutes, such as coffee creamers and sour cream substitutes. Salad dressings that are made of unknown oils, cheese, or sour cream. The items listed above may not be a complete list of foods and beverages to avoid. Contact your dietitian for more information.   This information is not intended to replace advice given to you by your health care provider. Make sure you discuss any questions you have with your health care provider.   Document Released: 06/19/2008 Document Revised: 10/01/2014 Document Reviewed: 03/04/2014 Elsevier Interactive Patient Education [employer].

## 2016-06-13 ENCOUNTER — Encounter: Payer: Self-pay | Admitting: Family Medicine

## 2016-06-13 ENCOUNTER — Ambulatory Visit (INDEPENDENT_AMBULATORY_CARE_PROVIDER_SITE_OTHER): Payer: BLUE CROSS/BLUE SHIELD | Admitting: Family Medicine

## 2016-06-13 VITALS — BP 138/80 | HR 69 | Temp 98.3°F | Resp 18 | Ht 69.0 in | Wt 188.0 lb

## 2016-06-13 DIAGNOSIS — E785 Hyperlipidemia, unspecified: Secondary | ICD-10-CM | POA: Diagnosis not present

## 2016-06-13 DIAGNOSIS — I10 Essential (primary) hypertension: Secondary | ICD-10-CM

## 2016-06-13 DIAGNOSIS — Z5181 Encounter for therapeutic drug level monitoring: Secondary | ICD-10-CM

## 2016-06-13 DIAGNOSIS — R011 Cardiac murmur, unspecified: Secondary | ICD-10-CM | POA: Diagnosis not present

## 2016-06-13 MED ORDER — ASPIRIN EC 81 MG PO TBEC: 81.0000 mg | DELAYED_RELEASE_TABLET | Freq: Every day | ORAL | Status: AC
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
## 2016-06-13 NOTE — Patient Instructions (Addendum)
[facility] is now offering annual lung cancer screening by low-dose CT scan.  This is covered for qualifying patients and your insurance will be checked before the procedure.  Call the lung cancer screening nurse navigators at [phone] to learn more about this and get scheduled.    Heart Murmur A heart murmur is an extra sound heard by your health care provider when listening to your heart with a device called a stethoscope. The sound comes from turbulence when blood flows through the heart and may be a "hum" or "whoosh" sound heard when the heart beats. There are two types of heart murmurs:  Innocent murmurs. Most people with this type of heart murmur do not have a heart problem. Many children have innocent heart murmurs. Your health care provider may suggest some basic testing to know whether your murmur is an innocent murmur. If an innocent heart murmur is found, there is no need for further tests or treatment and no need to restrict activities or stop playing sports.  Abnormal murmurs. These types of murmurs can occur in children and adults. In children, abnormal heart murmurs are typically caused from heart defects that are present at birth (congenital). In adults, abnormal murmurs are usually from heart valve problems caused by disease, infection, or aging. CAUSES  Normally, these valves open to let blood flow through or out of your heart and then shut to keep it from flowing backward. If they do not work properly, you could have:  Regurgitation--When blood leaks back through the valve in the wrong direction.  Mitral valve prolapse--When the mitral valve of the heart has a loose flap and does not close tightly.  Stenosis--When the valve does not open enough and blocks blood flow. SIGNS AND SYMPTOMS  Innocent murmurs do not cause symptoms, and many people with abnormal murmurs may or may not have symptoms. If symptoms do develop, they may include:  Shortness of breath.  Blue coloring  of the skin, especially on the fingertips.  Chest pain.  Palpitations, or feeling a fluttering or skipped heartbeat.  Fainting.  Persistent cough.  Getting tired much faster than expected. DIAGNOSIS  A heart murmur might be heard during a sports physical or during any type of examination. When a murmur is heard, it may suggest a possible problem. When this happens, your health care provider may ask you to see a heart specialist (cardiologist). You may also be asked to have one or more heart tests. In these cases, testing may vary depending on what your health care provider heard. Tests for a heart murmur may include:  Electrocardiogram.  Echocardiogram.  MRI. For children and adults who have an abnormal heart murmur and want to play sports, it is important to complete testing, review test results, and receive recommendations from your health care provider. If heart disease is present, it may not be safe to play. TREATMENT  Innocent murmurs require no treatment or activity restriction. If an abnormal murmur represents a problem with the heart, treatment will depend on the exact nature of the problem. In these cases, medicine or surgery may be needed to treat the problem. HOME CARE INSTRUCTIONS If you want to participate in sports or other types of strenuous physical activity, it is important to discuss this first with your health care provider. If the murmur represents a problem with the heart and you choose to participate in sports, there is a small chance that a serious problem (including sudden death) could result.  SEEK MEDICAL CARE IF:  You feel that your symptoms are slowly worsening.  You develop any new symptoms that cause concern.  You feel that you are having side effects from any medicines prescribed. SEEK IMMEDIATE MEDICAL CARE IF:   You develop chest pain.  You have shortness of breath.  You notice that your heart beats irregularly often enough to cause you to  worry.  You have fainting spells.  Your symptoms suddenly get worse.   This information is not intended to replace advice given to you by your health care provider. Make sure you discuss any questions you have with your health care provider.   Document Released: 10/18/2004 Document Revised: 10/01/2014 Document Reviewed: 05/18/2013 Elsevier Interactive Patient Education 2016 [employer].    IF you received an x-ray today, you will receive an invoice from [facility]. Please contact [facility] at [phone] with questions or concerns regarding your invoice.   IF you received labwork today, you will receive an invoice from [employer]. Please contact [facility] at [phone] with questions or concerns regarding your invoice.   Our billing staff will not be able to assist you with questions regarding bills from these companies.  You will be contacted with the lab results as soon as they are available. The fastest way to get your results is to activate your My Chart account. Instructions are located on the last page of this paperwork. If you have not heard from [place] regarding the results in 2 weeks, please contact this office.     DASH Eating Plan DASH stands for "Dietary Approaches to Stop Hypertension." The DASH eating plan is a healthy eating plan that has been shown to reduce high blood pressure (hypertension). Additional health benefits may include reducing the risk of type 2 diabetes mellitus, heart disease, and stroke. The DASH eating plan may also help with weight loss. WHAT DO I NEED TO KNOW ABOUT THE DASH EATING PLAN? For the DASH eating plan, you will follow these general guidelines:  Choose foods with a percent daily value for sodium of less than 5% (as listed on the food label).  Use salt-free seasonings or herbs instead of table salt or sea salt.  Check with your health care provider or pharmacist before using salt  substitutes.  Eat lower-sodium products, often labeled as "lower sodium" or "no salt added."  Eat fresh foods.  Eat more vegetables, fruits, and low-fat dairy products.  Choose whole grains. Look for the word "whole" as the first word in the ingredient list.  Choose fish and skinless chicken or [place] more often than red meat. Limit fish, poultry, and meat to 6 oz (170 g) each day.  Limit sweets, desserts, sugars, and sugary drinks.  Choose heart-healthy fats.  Limit cheese to 1 oz (28 g) per day.  Eat more home-cooked food and less restaurant, buffet, and fast food.  Limit fried foods.  Cook foods using methods other than frying.  Limit canned vegetables. If you do use them, rinse them well to decrease the sodium.  When eating at a restaurant, ask that your food be prepared with less salt, or no salt if possible. WHAT FOODS CAN I EAT? Seek help from a dietitian for individual calorie needs. Grains Whole grain or whole wheat bread. Brown rice. Whole grain or whole wheat pasta. Quinoa, bulgur, and whole grain cereals. Low-sodium cereals. Corn or whole wheat flour tortillas. Whole grain cornbread. Whole grain crackers. Low-sodium crackers. Vegetables Fresh or frozen vegetables (raw, steamed, roasted, or grilled). Low-sodium or  reduced-sodium tomato and vegetable juices. Low-sodium or reduced-sodium tomato sauce and paste. Low-sodium or reduced-sodium canned vegetables.  Fruits All fresh, canned (in natural juice), or frozen fruits. Meat and Other Protein Products Ground beef (85% or leaner), grass-fed beef, or beef trimmed of fat. Skinless chicken or [place]. Ground chicken or [place]. Pork trimmed of fat. All fish and seafood. Eggs. Dried beans, peas, or lentils. Unsalted nuts and seeds. Unsalted canned beans. Dairy Low-fat dairy products, such as skim or 1% milk, 2% or reduced-fat cheeses, low-fat ricotta or cottage cheese, or plain low-fat yogurt. Low-sodium or reduced-sodium  cheeses. Fats and Oils Tub margarines without trans fats. Light or reduced-fat mayonnaise and salad dressings (reduced sodium). Avocado. Safflower, olive, or canola oils. Natural peanut or almond butter. Other Unsalted popcorn and pretzels. The items listed above may not be a complete list of recommended foods or beverages. Contact your dietitian for more options. WHAT FOODS ARE NOT RECOMMENDED? Grains White bread. White pasta. White rice. Refined cornbread. Bagels and croissants. Crackers that contain trans fat. Vegetables Creamed or fried vegetables. Vegetables in a cheese sauce. Regular canned vegetables. Regular canned tomato sauce and paste. Regular tomato and vegetable juices. Fruits Dried fruits. Canned fruit in light or heavy syrup. Fruit juice. Meat and Other Protein Products Fatty cuts of meat. Ribs, chicken wings, bacon, sausage, bologna, salami, chitterlings, fatback, hot dogs, bratwurst, and packaged luncheon meats. Salted nuts and seeds. Canned beans with salt. Dairy Whole or 2% milk, cream, half-and-half, and cream cheese. Whole-fat or sweetened yogurt. Full-fat cheeses or blue cheese. Nondairy creamers and whipped toppings. Processed cheese, cheese spreads, or cheese curds. Condiments Onion and garlic salt, seasoned salt, table salt, and sea salt. Canned and packaged gravies. Worcestershire sauce. Tartar sauce. Barbecue sauce. Teriyaki sauce. Soy sauce, including reduced sodium. Steak sauce. Fish sauce. Oyster sauce. Cocktail sauce. Horseradish. Ketchup and mustard. Meat flavorings and tenderizers. Bouillon cubes. Hot sauce. Tabasco sauce. Marinades. Taco seasonings. Relishes. Fats and Oils Butter, stick margarine, lard, shortening, ghee, and bacon fat. Coconut, palm kernel, or palm oils. Regular salad dressings. Other Pickles and olives. Salted popcorn and pretzels. The items listed above may not be a complete list of foods and beverages to avoid. Contact your dietitian for  more information. WHERE CAN I FIND MORE INFORMATION? National Heart, Lung, and Blood Institute: travelstabloid.com   This information is not intended to replace advice given to you by your health care provider. Make sure you discuss any questions you have with your health care provider.   Document Released: 08/30/2011 Document Revised: 10/01/2014 Document Reviewed: 07/15/2013 Elsevier Interactive Patient Education [employer].

## 2016-06-14 ENCOUNTER — Ambulatory Visit: Payer: BLUE CROSS/BLUE SHIELD | Admitting: Family Medicine

## 2016-06-14 LAB — BASIC METABOLIC PANEL WITH GFR
BUN: 15 mg/dL (ref 7–25)
CALCIUM: 9.3 mg/dL (ref 8.6–10.3)
CO2: 24 mmol/L (ref 20–31)
CREATININE: 1.56 mg/dL — AB (ref 0.70–1.33)
Chloride: 104 mmol/L (ref 98–110)
GFR, Est African American: 56 mL/min — ABNORMAL LOW (ref >=60)
GFR, Est Non African American: 48 mL/min — ABNORMAL LOW (ref >=60)
Glucose, Bld: 117 mg/dL — ABNORMAL HIGH (ref 65–99)
Potassium: 3.8 mmol/L (ref 3.5–5.3)
SODIUM: 138 mmol/L (ref 135–146)

## 2016-06-18 ENCOUNTER — Encounter: Payer: Self-pay | Admitting: Family Medicine

## 2016-07-16 ENCOUNTER — Encounter: Payer: Self-pay | Admitting: Cardiology

## 2016-07-16 NOTE — Progress Notes (Signed)
Cardiology Office Note  Date: 07/17/2016   ID: Donald Zhang, DOB [birth year], MRN [id number]  PCP: Reginia Forts, MD  Referring provider: Delman Cheadle, MD  Consulting Cardiologist: Rozann Lesches, MD   Chief Complaint  Patient presents with  . Heart murmur    History of Present Illness: Donald Zhang is a 60 y.o. male referred for cardiology consultation by Dr. Brigitte Pulse for evaluation of heart murmur. He reports no history of heart murmur, no specific functional limitations. He works in [employer]. Reports NYHA class I dyspnea, no exertional chest pain or palpitations, no syncope.  He reports a relatively long-standing history of hypertension, has been on medications for at least the last 5 years. Reports no intolerances.  Current medications include aspirin, Lipitor, and Prinzide.  He does not recall any history of rheumatic heart disease as a child. No family history of valvular heart disease or sudden cardiac death.  Past Medical History:  Diagnosis Date  . Essential hypertension   . Hyperlipidemia     Past Surgical History:  Procedure Laterality Date  . CYSTOSCOPY/RETROGRADE/URETEROSCOPY/STONE EXTRACTION WITH BASKET Right 02/04/2015   Procedure: CYSTOSCOPY/RETROGRADE/URETEROSCOPY/STONE EXTRACTION WITH BASKET/STENT PLACEMENT RIGHT;  Surgeon: Rana Snare, MD;  Location: [facility];  Service: Urology;  Laterality: Right;  . HOLMIUM LASER APPLICATION Right [id number]   Procedure: HOLMIUM LASER APPLICATION;  Surgeon: Rana Snare, MD;  Location: [facility];  Service: Urology;  Laterality: Right;    Current Outpatient Prescriptions  Medication Sig Dispense Refill  . aspirin EC 81 MG tablet Take 1 tablet (81 mg total) by mouth daily.    Marland Kitchen atorvastatin (LIPITOR) 40 MG tablet Take 1 tablet (40 mg total) by mouth daily. 90 tablet 3  . lisinopril-hydrochlorothiazide (PRINZIDE,ZESTORETIC) 20-12.5 MG tablet TAKE ONE TABLET BY MOUTH ONCE DAILY FOR BLOOD PRESSURE 90 tablet 3   No  current facility-administered medications for this visit.    Allergies:  Review of patient's allergies indicates no known allergies.   Social History: The patient  reports that he has been smoking.  He started smoking about 42 years ago. He has never used smokeless tobacco. He reports that he drinks about 3.6 oz of alcohol per week . He reports that he does not use drugs.   Family History: The patient's family history includes Diabetes in his brother and father; Hypertension in his mother.   ROS:  Please see the history of present illness. Otherwise, complete review of systems is positive for none.  All other systems are reviewed and negative.   Physical Exam: VS:  BP 140/78 (BP Location: Right Arm)   Pulse 71   Ht 5\' 9"  (1.753 m)   Wt 197 lb (89.4 kg)   SpO2 97%   BMI 29.09 kg/m , BMI Body mass index is 29.09 kg/m.  Wt Readings from Last 3 Encounters:  07/17/16 197 lb (89.4 kg)  06/13/16 188 lb (85.3 kg)  05/21/16 203 lb (92.1 kg)    General: Patient appears comfortable at rest. HEENT: Conjunctiva and lids normal, oropharynx clear with moist mucosa. Neck: Supple, no elevated JVP or carotid bruits, no thyromegaly. Lungs: Clear to auscultation, nonlabored breathing at rest. Cardiac: Regular rate and rhythm, no S3, soft systolic murmur in region of right upper sternal border, no diastolic murmur, no pericardial rub. Abdomen: Soft, nontender, bowel sounds present, no guarding or rebound. Extremities: No pitting edema, distal pulses 2+. Skin: Warm and dry. Musculoskeletal: No kyphosis. Neuropsychiatric: Alert and oriented x3, affect grossly appropriate.  ECG: I personally reviewed the tracing  from 05/21/2016 which showed sinus bradycardia with PACs and nonspecific T-wave abnormalities.  Recent Labwork: 05/21/2016: ALT 15; AST 15 06/13/2016: BUN 15; Creat 1.56; Potassium 3.8; Sodium 138     Component Value Date/Time   CHOL 139 05/21/2016 0839   TRIG 135 05/21/2016 0839   HDL 34  (L) 05/21/2016 0839   CHOLHDL 4.1 05/21/2016 0839   VLDL 27 05/21/2016 0839   LDLCALC 78 05/21/2016 0839   Assessment and Plan:  1. Soft systolic heart murmur, rule out subclavian bruit with differential blood pressure in upper extremities (approximately 20 mmHg difference right greater than left). We will plan to obtain an echocardiogram to assess cardiac structure and function, also upper extremity Dopplers mainly to exclude subclavian stenosis, although even if present it does not sound like he is symptomatic.  2. Essential hypertension, continues on Prinzide.  3. Hyperlipidemia, on Lipitor with LDL 78 in August.  Current medicines were reviewed with the patient today.   Orders Placed This Encounter  Procedures  . ECHOCARDIOGRAM COMPLETE    Disposition: Call with test results.  Signed, Satira Sark, MD, [facility] 07/17/2016 2:16 PM    [facility] [address] [place], [place] [address] Phone: [phone]; Fax: [phone]

## 2016-07-17 ENCOUNTER — Encounter: Payer: Self-pay | Admitting: Cardiology

## 2016-07-17 ENCOUNTER — Ambulatory Visit (INDEPENDENT_AMBULATORY_CARE_PROVIDER_SITE_OTHER): Payer: BLUE CROSS/BLUE SHIELD | Admitting: Cardiology

## 2016-07-17 VITALS — BP 140/78 | HR 71 | Ht 69.0 in | Wt 197.0 lb

## 2016-07-17 DIAGNOSIS — I10 Essential (primary) hypertension: Secondary | ICD-10-CM

## 2016-07-17 DIAGNOSIS — R0989 Other specified symptoms and signs involving the circulatory and respiratory systems: Secondary | ICD-10-CM

## 2016-07-17 DIAGNOSIS — R011 Cardiac murmur, unspecified: Secondary | ICD-10-CM | POA: Diagnosis not present

## 2016-07-17 DIAGNOSIS — E782 Mixed hyperlipidemia: Secondary | ICD-10-CM | POA: Diagnosis not present

## 2016-07-17 NOTE — Patient Instructions (Signed)
Your physician recommends that you schedule a follow-up appointment in: we will call with results    Your physician recommends that you continue on your current medications as directed. Please refer to the Current Medication list given to you today.    Your physician has requested that you have an echocardiogram. Echocardiography is a painless test that uses sound waves to create images of your heart. It provides your doctor with information about the size and shape of your heart and how well your heart's chambers and valves are working. This procedure takes approximately one hour. There are no restrictions for this procedure.    Please get upper extremity artrial doppler when you have your echocardiogram      Thank you for choosing [facility] !

## 2016-07-23 ENCOUNTER — Other Ambulatory Visit: Payer: Self-pay | Admitting: Cardiology

## 2016-07-23 DIAGNOSIS — R0989 Other specified symptoms and signs involving the circulatory and respiratory systems: Secondary | ICD-10-CM

## 2016-07-25 ENCOUNTER — Other Ambulatory Visit: Payer: Self-pay | Admitting: Cardiology

## 2016-07-25 DIAGNOSIS — I771 Stricture of artery: Secondary | ICD-10-CM

## 2016-07-25 DIAGNOSIS — R0989 Other specified symptoms and signs involving the circulatory and respiratory systems: Secondary | ICD-10-CM

## 2016-08-09 ENCOUNTER — Ambulatory Visit: Payer: BLUE CROSS/BLUE SHIELD

## 2016-08-09 DIAGNOSIS — R011 Cardiac murmur, unspecified: Secondary | ICD-10-CM

## 2016-08-09 DIAGNOSIS — I771 Stricture of artery: Secondary | ICD-10-CM

## 2016-08-09 DIAGNOSIS — R0989 Other specified symptoms and signs involving the circulatory and respiratory systems: Secondary | ICD-10-CM

## 2016-08-09 LAB — VAS US CAROTID
LCCAPDIAS: 42 cm/s
LCCAPSYS: 176 cm/s
LEFT ECA DIAS: -21 cm/s
LEFT VERTEBRAL DIAS: -21 cm/s
LICAPSYS: -65 cm/s
Left CCA dist dias: -48 cm/s
Left CCA dist sys: -148 cm/s
Left ICA dist dias: -52 cm/s
Left ICA dist sys: -117 cm/s
Left ICA prox dias: -12 cm/s
RCCADSYS: -102 cm/s
RCCAPDIAS: 32 cm/s
RIGHT ECA DIAS: -12 cm/s
RIGHT VERTEBRAL DIAS: -21 cm/s
Right CCA prox sys: 167 cm/s

## 2017-01-28 ENCOUNTER — Ambulatory Visit (INDEPENDENT_AMBULATORY_CARE_PROVIDER_SITE_OTHER): Payer: BLUE CROSS/BLUE SHIELD

## 2017-01-28 ENCOUNTER — Encounter: Payer: Self-pay | Admitting: Family Medicine

## 2017-01-28 ENCOUNTER — Ambulatory Visit (INDEPENDENT_AMBULATORY_CARE_PROVIDER_SITE_OTHER): Payer: BLUE CROSS/BLUE SHIELD | Admitting: Family Medicine

## 2017-01-28 VITALS — BP 144/80 | HR 91 | Temp 99.1°F | Resp 16 | Ht 68.5 in | Wt 176.6 lb

## 2017-01-28 DIAGNOSIS — I499 Cardiac arrhythmia, unspecified: Secondary | ICD-10-CM | POA: Diagnosis not present

## 2017-01-28 DIAGNOSIS — R634 Abnormal weight loss: Secondary | ICD-10-CM | POA: Diagnosis not present

## 2017-01-28 DIAGNOSIS — E0590 Thyrotoxicosis, unspecified without thyrotoxic crisis or storm: Secondary | ICD-10-CM

## 2017-01-28 DIAGNOSIS — F17200 Nicotine dependence, unspecified, uncomplicated: Secondary | ICD-10-CM | POA: Diagnosis not present

## 2017-01-28 DIAGNOSIS — E785 Hyperlipidemia, unspecified: Secondary | ICD-10-CM

## 2017-01-28 DIAGNOSIS — R221 Localized swelling, mass and lump, neck: Secondary | ICD-10-CM

## 2017-01-28 DIAGNOSIS — I10 Essential (primary) hypertension: Secondary | ICD-10-CM

## 2017-01-28 LAB — POCT URINALYSIS DIP (MANUAL ENTRY)
BILIRUBIN UA: NEGATIVE
Blood, UA: NEGATIVE
Glucose, UA: NEGATIVE mg/dL
Ketones, POC UA: NEGATIVE mg/dL
NITRITE UA: NEGATIVE
Protein Ur, POC: NEGATIVE mg/dL
Spec Grav, UA: 1.01 (ref 1.010–1.025)
Urobilinogen, UA: 1 E.U./dL
pH, UA: 6 (ref 5.0–8.0)

## 2017-01-28 LAB — POCT GLYCOSYLATED HEMOGLOBIN (HGB A1C): Hemoglobin A1C: 5.7

## 2017-01-28 MED ORDER — LISINOPRIL-HYDROCHLOROTHIAZIDE 20-25 MG PO TABS: 1.0000 | ORAL_TABLET | Freq: Every day | ORAL | 0 refills | Status: DC
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
## 2017-01-28 NOTE — Patient Instructions (Addendum)
Your blood sugar is fine - no diabetes.  Your chest xray is fine.  You will be called and asked questions about your smoking history so we can get you set up for a CT scan of your lungs that has much less radiation than a normal CT scan and is covered in entirety by your insurance as part of a lung cancer screening program. Make an appointment to recheck with me in about 2 weeks for a full physical so we can do further evaluation if we haven't found the cause already from the labs we drew today.  I increased the dose on your blood pressure medication just a little to get better control. This new dose has been sent to your pharmacy.  IF you received an x-ray today, you will receive an invoice from [facility]. Please contact [facility] at [phone] with questions or concerns regarding your invoice.   IF you received labwork today, you will receive an invoice from [place]. Please contact [facility] at [phone] with questions or concerns regarding your invoice.   Our billing staff will not be able to assist you with questions regarding bills from these companies.  You will be contacted with the lab results as soon as they are available. The fastest way to get your results is to activate your My Chart account. Instructions are located on the last page of this paperwork. If you have not heard from [place] regarding the results in 2 weeks, please contact this office.

## 2017-01-29 LAB — CBC WITH DIFFERENTIAL/PLATELET
BASOS: 0 %
Basophils Absolute: 0 10*3/uL (ref 0.0–0.2)
EOS (ABSOLUTE): 0.2 10*3/uL (ref 0.0–0.4)
Eos: 3 %
Hematocrit: 38.6 % (ref 37.5–51.0)
Hemoglobin: 13.4 g/dL (ref 13.0–17.7)
IMMATURE GRANS (ABS): 0 10*3/uL (ref 0.0–0.1)
IMMATURE GRANULOCYTES: 0 %
LYMPHS: 29 %
Lymphocytes Absolute: 1.8 10*3/uL (ref 0.7–3.1)
MCH: 28.3 pg (ref 26.6–33.0)
MCHC: 34.7 g/dL (ref 31.5–35.7)
MCV: 82 fL (ref 79–97)
Monocytes Absolute: 0.5 10*3/uL (ref 0.1–0.9)
Monocytes: 7 %
NEUTROS PCT: 61 %
Neutrophils Absolute: 3.7 10*3/uL (ref 1.4–7.0)
PLATELETS: 217 10*3/uL (ref 150–379)
RBC: 4.73 x10E6/uL (ref 4.14–5.80)
RDW: 13.9 % (ref 12.3–15.4)
WBC: 6.1 10*3/uL (ref 3.4–10.8)

## 2017-01-29 LAB — COMPREHENSIVE METABOLIC PANEL
A/G RATIO: 1.6 (ref 1.2–2.2)
ALT: 26 IU/L (ref 0–44)
AST: 19 IU/L (ref 0–40)
Albumin: 3.9 g/dL (ref 3.5–5.5)
Alkaline Phosphatase: 111 IU/L (ref 39–117)
BUN/Creatinine Ratio: 18 (ref 9–20)
BUN: 22 mg/dL (ref 6–24)
Bilirubin Total: 0.6 mg/dL (ref 0.0–1.2)
CALCIUM: 10.1 mg/dL (ref 8.7–10.2)
CO2: 23 mmol/L (ref 18–29)
CREATININE: 1.24 mg/dL (ref 0.76–1.27)
Chloride: 104 mmol/L (ref 96–106)
GFR calc non Af Amer: 63 mL/min/{1.73_m2} (ref >59)
GFR, EST AFRICAN AMERICAN: 73 mL/min/{1.73_m2} (ref >59)
GLUCOSE: 103 mg/dL — AB (ref 65–99)
Globulin, Total: 2.4 g/dL (ref 1.5–4.5)
Potassium: 4.7 mmol/L (ref 3.5–5.2)
Sodium: 142 mmol/L (ref 134–144)
TOTAL PROTEIN: 6.3 g/dL (ref 6.0–8.5)

## 2017-01-29 LAB — URINE CULTURE

## 2017-01-29 LAB — HIV ANTIBODY (ROUTINE TESTING W REFLEX): HIV Screen 4th Generation wRfx: NONREACTIVE

## 2017-01-29 LAB — HCV AB W/RFLX TO VERIFICATION: HCV Ab: <0.1 s/co ratio (ref 0.0–0.9)

## 2017-01-29 LAB — C-REACTIVE PROTEIN: CRP: 8.1 mg/L — AB (ref 0.0–4.9)

## 2017-01-29 LAB — PSA: Prostate Specific Ag, Serum: 1 ng/mL (ref 0.0–4.0)

## 2017-01-29 LAB — HCV INTERPRETATION

## 2017-01-29 LAB — TSH: TSH: <0.006 u[IU]/mL — ABNORMAL LOW (ref 0.450–4.500)

## 2017-01-29 LAB — SEDIMENTATION RATE: Sed Rate: 12 mm/hr (ref 0–30)

## 2017-02-08 NOTE — Addendum Note (Signed)
Addended by: Delman Cheadle on: 02/08/2017 12:05 PM   Modules accepted: Orders
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
## 2017-02-11 ENCOUNTER — Encounter: Payer: Self-pay | Admitting: Family Medicine

## 2017-02-11 ENCOUNTER — Ambulatory Visit (INDEPENDENT_AMBULATORY_CARE_PROVIDER_SITE_OTHER): Payer: BLUE CROSS/BLUE SHIELD | Admitting: Family Medicine

## 2017-02-11 ENCOUNTER — Ambulatory Visit
Admission: RE | Admit: 2017-02-11 | Discharge: 2017-02-11 | Disposition: A | Discharge status: Home / Self Care | Payer: BLUE CROSS/BLUE SHIELD | Source: Ambulatory Visit | Attending: Family Medicine | Admitting: Family Medicine

## 2017-02-11 VITALS — BP 125/73 | HR 80 | Temp 98.2°F | Resp 16 | Ht 69.0 in | Wt 169.0 lb

## 2017-02-11 DIAGNOSIS — E785 Hyperlipidemia, unspecified: Secondary | ICD-10-CM

## 2017-02-11 DIAGNOSIS — I10 Essential (primary) hypertension: Secondary | ICD-10-CM | POA: Diagnosis not present

## 2017-02-11 DIAGNOSIS — E0590 Thyrotoxicosis, unspecified without thyrotoxic crisis or storm: Secondary | ICD-10-CM

## 2017-02-11 DIAGNOSIS — R634 Abnormal weight loss: Secondary | ICD-10-CM | POA: Diagnosis not present

## 2017-02-11 DIAGNOSIS — D179 Benign lipomatous neoplasm, unspecified: Secondary | ICD-10-CM | POA: Diagnosis not present

## 2017-02-11 DIAGNOSIS — R221 Localized swelling, mass and lump, neck: Secondary | ICD-10-CM

## 2017-02-11 DIAGNOSIS — F17200 Nicotine dependence, unspecified, uncomplicated: Secondary | ICD-10-CM

## 2017-02-11 NOTE — Patient Instructions (Addendum)
     IF you received an x-ray today, you will receive an invoice from [facility]. Please contact [facility] at [phone] with questions or concerns regarding your invoice.   IF you received labwork today, you will receive an invoice from [facility]. Please contact [facility] at [phone] with questions or concerns regarding your invoice.   Our billing staff will not be able to assist you with questions regarding bills from these companies.  You will be contacted with the lab results as soon as they are available. The fastest way to get your results is to activate your My Chart account. Instructions are located on the last page of this paperwork. If you have not heard from us regarding the results in 2 weeks, please contact this office.      Hyperthyroidism Hyperthyroidism is when the thyroid is too active (overactive). Your thyroid is a large gland that is located in your neck. The thyroid helps to control how your body uses food (metabolism). When your thyroid is overactive, it produces too much of a hormone called thyroxine. What are the causes? Causes of hyperthyroidism may include:  Graves disease. This is when your immune system attacks the thyroid gland. This is the most common cause.  Inflammation of the thyroid gland.  Tumor in the thyroid gland or somewhere else.  Excessive use of thyroid medicines, including: ? Prescription thyroid supplement. ? Herbal supplements that mimic thyroid hormones.  Solid or fluid-filled lumps within your thyroid gland (thyroid nodules).  Excessive ingestion of iodine.  What increases the risk?  Being male.  Having a family history of thyroid conditions. What are the signs or symptoms? Signs and symptoms of hyperthyroidism may include:  Nervousness.  Inability to tolerate heat.  Unexplained weight loss.  Diarrhea.  Change in the texture of hair or skin.  Heart skipping beats or making extra  beats.  Rapid heart rate.  Loss of menstruation.  Shaky hands.  Fatigue.  Restlessness.  Increased appetite.  Sleep problems.  Enlarged thyroid gland or nodules.  How is this diagnosed? Diagnosis of hyperthyroidism may include:  Medical history and physical exam.  Blood tests.  Ultrasound tests.  How is this treated? Treatment may include:  Medicines to control your thyroid.  Surgery to remove your thyroid.  Radiation therapy.  Follow these instructions at home:  Take medicines only as directed by your health care provider.  Do not use any tobacco products, including cigarettes, chewing tobacco, or electronic cigarettes. If you need help quitting, ask your health care provider.  Do not exercise or do physical activity until your health care provider approves.  Keep all follow-up appointments as directed by your health care provider. This is important. Contact a health care provider if:  Your symptoms do not get better with treatment.  You have fever.  You are taking thyroid replacement medicine and you: ? Have depression. ? Feel mentally and physically slow. ? Have weight gain. Get help right away if:  You have decreased alertness or a change in your awareness.  You have abdominal pain.  You feel dizzy.  You have a rapid heartbeat.  You have an irregular heartbeat. This information is not intended to replace advice given to you by your health care provider. Make sure you discuss any questions you have with your health care provider. Document Released: 09/10/2005 Document Revised: 02/09/2016 Document Reviewed: 01/26/2014 Elsevier Interactive Patient Education  2017 [employer]  

## 2017-02-12 LAB — THYROID PANEL WITH TSH
Free Thyroxine Index: 9.9 — ABNORMAL HIGH (ref 1.2–4.9)
T3 UPTAKE RATIO: 51 % — AB (ref 24–39)
T4 TOTAL: 19.4 ug/dL — AB (ref 4.5–12.0)

## 2017-02-12 LAB — THYROTROPIN RECEPTOR AUTOABS: Thyrotropin Receptor Ab: 11.05 IU/L — ABNORMAL HIGH (ref 0.00–1.75)

## 2017-02-12 LAB — T3, FREE: T3 FREE: 23 pg/mL — AB (ref 2.0–4.4)

## 2017-02-12 LAB — T3: T3, Total: 525 ng/dL — ABNORMAL HIGH (ref 71–180)

## 2017-02-14 ENCOUNTER — Ambulatory Visit (INDEPENDENT_AMBULATORY_CARE_PROVIDER_SITE_OTHER): Payer: BLUE CROSS/BLUE SHIELD | Admitting: Endocrinology

## 2017-02-14 ENCOUNTER — Encounter: Payer: Self-pay | Admitting: Endocrinology

## 2017-02-14 DIAGNOSIS — E0590 Thyrotoxicosis, unspecified without thyrotoxic crisis or storm: Secondary | ICD-10-CM | POA: Diagnosis not present

## 2017-02-14 MED ORDER — METHIMAZOLE 10 MG PO TABS
40.0000 mg | ORAL_TABLET | Freq: Two times a day (BID) | ORAL | 2 refills | Status: DC
Start: 2017-02-14 — End: 2017-05-13

## 2017-02-14 NOTE — Progress Notes (Signed)
Subjective:    Patient ID: Donald Zhang, male    DOB: 07-26-1956, 61 y.o.   MRN: [id number]  HPI Pt is referred by Dr Brigitte Pulse, for hyperthyroidism.  A few weeks ago, pt was found to have abnormal TFT.  He has no previous thyroid hx, and has never been on therapy for this.  He has never had XRT to the anterior neck, or thyroid surgery.  He has never had thyroid imaging.  He does not consume kelp or any other prescribed or non-prescribed thyroid medication.  He has never been on amiodarone.  He has slight palpitations in the chest, and assoc weight loss.  Past Medical History:  Diagnosis Date  . Essential hypertension   . Hyperlipidemia     Past Surgical History:  Procedure Laterality Date  . CYSTOSCOPY/RETROGRADE/URETEROSCOPY/STONE EXTRACTION WITH BASKET Right 02/04/2015   Procedure: CYSTOSCOPY/RETROGRADE/URETEROSCOPY/STONE EXTRACTION WITH BASKET/STENT PLACEMENT RIGHT;  Surgeon: Rana Snare, MD;  Location: [facility];  Service: Urology;  Laterality: Right;  . HOLMIUM LASER APPLICATION Right 3/79/0240   Procedure: HOLMIUM LASER APPLICATION;  Surgeon: Rana Snare, MD;  Location: [facility];  Service: Urology;  Laterality: Right;    Social History   Social History  . Marital status: Married    Spouse name: N/A  . Number of children: N/A  . Years of education: N/A   Occupational History  . Not on file.   Social History Main Topics  . Smoking status: Current Every Day Smoker    Start date: 07/17/1974  . Smokeless tobacco: Never Used  . Alcohol use 3.6 oz/week    6 Cans of beer per week  . Drug use: No  . Sexual activity: Yes   Other Topics Concern  . Not on file   Social History Narrative  . No narrative on file    Current Outpatient Prescriptions on File Prior to Visit  Medication Sig Dispense Refill  . aspirin EC 81 MG tablet Take 1 tablet (81 mg total) by mouth daily.    Marland Kitchen atorvastatin (LIPITOR) 40 MG tablet Take 1 tablet (40 mg total) by mouth daily. 90 tablet 3  .  lisinopril-hydrochlorothiazide (PRINZIDE,ZESTORETIC) 20-25 MG tablet Take 1 tablet by mouth daily. 90 tablet 0   No current facility-administered medications on file prior to visit.     No Known Allergies  Family History  Problem Relation Age of Onset  . Hypertension Mother   . Diabetes Father   . Diabetes Brother   . Thyroid disease Neg Hx     BP 136/64   Pulse 90   Ht 5\' 9"  (1.753 m)   Wt 171 lb (77.6 kg)   SpO2 95%   BMI 25.25 kg/m   Review of Systems denies fever headache, hoarseness, diplopia, edema, sob, diarrhea, polyuria, anxiety, heat intolerance, easy bruising, and rhinorrhea.  He has tremor, generalized muscle weakness, and excessive diaphoresis.       Objective:   Physical Exam VS: see vs page GEN: no distress HEAD: head: no deformity eyes: no periorbital swelling; moderate bilat proptosis external nose and ears are normal mouth: no lesion seen NECK: thyroid is approx 5 times normal size, bilateral.   CHEST WALL: no deformity LUNGS: clear to auscultation CV: reg rate and rhythm, no murmur ABD: abdomen is soft, nontender.  no hepatosplenomegaly.  not distended.  no hernia MUSCULOSKELETAL: muscle bulk and strength are grossly normal.  no obvious joint swelling.  gait is normal and steady EXTEMITIES: no deformity.  no ulcer on the feet.  feet are of normal color and temp.  no edema PULSES: dorsalis pedis intact bilat.  no carotid bruit NEURO:  cn 2-12 grossly intact.   readily moves all 4's.  sensation is intact to touch on the feet.  Slight tremor of the hands.  SKIN:  Normal texture and temperature.  No rash or suspicious lesion is visible.  Not diaphoretic NODES:  None palpable at the neck PSYCH: alert, well-oriented.  Does not appear anxious nor depressed.   Lab Results  Component Value Date   TSH <0.006 (L) 02/11/2017   T3TOTAL 525 (H) 02/11/2017   T4TOTAL 19.4 (HH) 02/11/2017   I have reviewed outside records, and summarized: Pt was noted to have  hyperthyrodism, and referred here.  He reported polyuria and weight loss, but glucose was normal.     Assessment & Plan:  Grave's Dz, new to me Hyperthyroidism, due to [place].  We discussed rx options.  He chooses tapazole, at least for now.   Patient Instructions  I have sent a prescription to your pharmacy, to slow the thyroid. Please come back for a follow-up appointment in 2 weeks If ever you have fever while taking methimazole, stop it and call us, even if the reason is obvious, because of the risk of a rare side-effect. In the future, if you want to, you can take one of the other thyroid treatments we discussed.  Or if you want, you can continue this indefinitely.             Hyperthyroidism Hyperthyroidism is when the thyroid is too active (overactive). Your thyroid is a large gland that is located in your neck. The thyroid helps to control how your body uses food (metabolism). When your thyroid is overactive, it produces too much of a hormone called thyroxine. What are the causes? Causes of hyperthyroidism may include:  Graves disease. This is when your immune system attacks the thyroid gland. This is the most common cause.  Inflammation of the thyroid gland.  Tumor in the thyroid gland or somewhere else.  Excessive use of thyroid medicines, including:  Prescription thyroid supplement.  Herbal supplements that mimic thyroid hormones.  Solid or fluid-filled lumps within your thyroid gland (thyroid nodules).  Excessive ingestion of iodine. What increases the risk?  Being male.  Having a family history of thyroid conditions. What are the signs or symptoms? Signs and symptoms of hyperthyroidism may include:  Nervousness.  Inability to tolerate heat.  Unexplained weight loss.  Diarrhea.  Change in the texture of hair or skin.  Heart skipping beats or making extra beats.  Rapid heart rate.  Loss of menstruation.  Shaky  hands.  Fatigue.  Restlessness.  Increased appetite.  Sleep problems.  Enlarged thyroid gland or nodules. How is this diagnosed? Diagnosis of hyperthyroidism may include:  Medical history and physical exam.  Blood tests.  Ultrasound tests. How is this treated? Treatment may include:  Medicines to control your thyroid.  Surgery to remove your thyroid.  Radiation therapy. Follow these instructions at home:  Take medicines only as directed by your health care provider.  Do not use any tobacco products, including cigarettes, chewing tobacco, or electronic cigarettes. If you need help quitting, ask your health care provider.  Do not exercise or do physical activity until your health care provider approves.  Keep all follow-up appointments as directed by your health care provider. This is important. Contact a health care provider if:  Your symptoms do not get better with treatment.  You have fever.  You are taking thyroid replacement medicine and you:  Have depression.  Feel mentally and physically slow.  Have weight gain. Get help right away if:  You have decreased alertness or a change in your awareness.  You have abdominal pain.  You feel dizzy.  You have a rapid heartbeat.  You have an irregular heartbeat. This information is not intended to replace advice given to you by your health care provider. Make sure you discuss any questions you have with your health care provider. Document Released: 09/10/2005 Document Revised: 02/09/2016 Document Reviewed: 01/26/2014 Elsevier Interactive Patient Education  2017 [employer].

## 2017-02-14 NOTE — Patient Instructions (Addendum)
I have sent a prescription to your pharmacy, to slow the thyroid. Please come back for a follow-up appointment in 2 weeks If ever you have fever while taking methimazole, stop it and call us, even if the reason is obvious, because of the risk of a rare side-effect. In the future, if you want to, you can take one of the other thyroid treatments we discussed.  Or if you want, you can continue this indefinitely.             Hyperthyroidism Hyperthyroidism is when the thyroid is too active (overactive). Your thyroid is a large gland that is located in your neck. The thyroid helps to control how your body uses food (metabolism). When your thyroid is overactive, it produces too much of a hormone called thyroxine. What are the causes? Causes of hyperthyroidism may include:  Graves disease. This is when your immune system attacks the thyroid gland. This is the most common cause.  Inflammation of the thyroid gland.  Tumor in the thyroid gland or somewhere else.  Excessive use of thyroid medicines, including:  Prescription thyroid supplement.  Herbal supplements that mimic thyroid hormones.  Solid or fluid-filled lumps within your thyroid gland (thyroid nodules).  Excessive ingestion of iodine. What increases the risk?  Being male.  Having a family history of thyroid conditions. What are the signs or symptoms? Signs and symptoms of hyperthyroidism may include:  Nervousness.  Inability to tolerate heat.  Unexplained weight loss.  Diarrhea.  Change in the texture of hair or skin.  Heart skipping beats or making extra beats.  Rapid heart rate.  Loss of menstruation.  Shaky hands.  Fatigue.  Restlessness.  Increased appetite.  Sleep problems.  Enlarged thyroid gland or nodules. How is this diagnosed? Diagnosis of hyperthyroidism may include:  Medical history and physical exam.  Blood tests.  Ultrasound tests. How is this treated? Treatment may  include:  Medicines to control your thyroid.  Surgery to remove your thyroid.  Radiation therapy. Follow these instructions at home:  Take medicines only as directed by your health care provider.  Do not use any tobacco products, including cigarettes, chewing tobacco, or electronic cigarettes. If you need help quitting, ask your health care provider.  Do not exercise or do physical activity until your health care provider approves.  Keep all follow-up appointments as directed by your health care provider. This is important. Contact a health care provider if:  Your symptoms do not get better with treatment.  You have fever.  You are taking thyroid replacement medicine and you:  Have depression.  Feel mentally and physically slow.  Have weight gain. Get help right away if:  You have decreased alertness or a change in your awareness.  You have abdominal pain.  You feel dizzy.  You have a rapid heartbeat.  You have an irregular heartbeat. This information is not intended to replace advice given to you by your health care provider. Make sure you discuss any questions you have with your health care provider. Document Released: 09/10/2005 Document Revised: 02/09/2016 Document Reviewed: 01/26/2014 Elsevier Interactive Patient Education  2017 [employer].

## 2017-02-16 DIAGNOSIS — E0590 Thyrotoxicosis, unspecified without thyrotoxic crisis or storm: Secondary | ICD-10-CM | POA: Insufficient documentation

## 2017-03-01 ENCOUNTER — Ambulatory Visit (INDEPENDENT_AMBULATORY_CARE_PROVIDER_SITE_OTHER): Payer: BLUE CROSS/BLUE SHIELD | Admitting: Endocrinology

## 2017-03-01 ENCOUNTER — Encounter: Payer: Self-pay | Admitting: Endocrinology

## 2017-03-01 VITALS — BP 146/74 | HR 77 | Ht 69.0 in | Wt 173.0 lb

## 2017-03-01 DIAGNOSIS — E0590 Thyrotoxicosis, unspecified without thyrotoxic crisis or storm: Secondary | ICD-10-CM

## 2017-03-01 LAB — T4, FREE: FREE T4: 2.83 ng/dL — AB (ref 0.60–1.60)

## 2017-03-01 LAB — TSH

## 2017-03-01 NOTE — Patient Instructions (Signed)
blood tests are requested for you today.  We'll let you know about the results. If ever you have fever while taking methimazole, stop it and call us, even if the reason is obvious, because of the risk of a rare side-effect.  Please come back for a follow-up appointment in 3 months.   

## 2017-03-01 NOTE — Progress Notes (Signed)
   Subjective:    Patient ID: Donald Zhang, male    DOB: Feb 01, 1956, 61 y.o.   MRN: [id number]  HPI Pt returns for f/u of hyperthyroidism (dx'ed early 2018; he has never had dedicated thyroid imaging; he chose tapazole rx).  Since on tapazole, pt states he feels better in general.   Past Medical History:  Diagnosis Date  . Essential hypertension   . Hyperlipidemia     Past Surgical History:  Procedure Laterality Date  . CYSTOSCOPY/RETROGRADE/URETEROSCOPY/STONE EXTRACTION WITH BASKET Right 02/04/2015   Procedure: CYSTOSCOPY/RETROGRADE/URETEROSCOPY/STONE EXTRACTION WITH BASKET/STENT PLACEMENT RIGHT;  Surgeon: Rana Snare, MD;  Location: [facility];  Service: Urology;  Laterality: Right;  . HOLMIUM LASER APPLICATION Right 12/31/8117   Procedure: HOLMIUM LASER APPLICATION;  Surgeon: Rana Snare, MD;  Location: [facility];  Service: Urology;  Laterality: Right;    Social History   Social History  . Marital status: Married    Spouse name: N/A  . Number of children: N/A  . Years of education: N/A   Occupational History  . Not on file.   Social History Main Topics  . Smoking status: Current Every Day Smoker    Start date: 07/17/1974  . Smokeless tobacco: Never Used  . Alcohol use 3.6 oz/week    6 Cans of beer per week  . Drug use: No  . Sexual activity: Yes   Other Topics Concern  . Not on file   Social History Narrative  . No narrative on file    Current Outpatient Prescriptions on File Prior to Visit  Medication Sig Dispense Refill  . aspirin EC 81 MG tablet Take 1 tablet (81 mg total) by mouth daily.    Marland Kitchen atorvastatin (LIPITOR) 40 MG tablet Take 1 tablet (40 mg total) by mouth daily. 90 tablet 3  . lisinopril-hydrochlorothiazide (PRINZIDE,ZESTORETIC) 20-25 MG tablet Take 1 tablet by mouth daily. 90 tablet 0  . methimazole (TAPAZOLE) 10 MG tablet Take 4 tablets (40 mg total) by mouth 2 (two) times daily. 240 tablet 2   No current facility-administered medications on  file prior to visit.     No Known Allergies  Family History  Problem Relation Age of Onset  . Hypertension Mother   . Diabetes Father   . Diabetes Brother   . Thyroid disease Neg Hx     BP (!) 146/74   Pulse 77   Ht 5\' 9"  (1.753 m)   Wt 173 lb (78.5 kg)   SpO2 96%   BMI 25.55 kg/m   Review of Systems Denies fever    Objective:   Physical Exam VS: see vs page GEN: no distress eyes: no periorbital swelling; moderate bilat proptosis NECK: thyroid is approx 5 times normal size, bilateral.   Lab Results  Component Value Date   TSH <0.01 (L) 03/01/2017   T3TOTAL 525 (H) 02/11/2017   T4TOTAL 19.4 (HH) 02/11/2017      Assessment & Plan:  Hyperthyroidism, not improved--? compliance.  Please continue the same medication. Please come back for a follow-up appointment in 1 month.

## 2017-05-12 ENCOUNTER — Other Ambulatory Visit: Payer: Self-pay | Admitting: Endocrinology

## 2017-05-12 ENCOUNTER — Other Ambulatory Visit: Payer: Self-pay | Admitting: Family Medicine

## 2017-05-13 ENCOUNTER — Other Ambulatory Visit: Payer: Self-pay

## 2017-05-13 MED ORDER — METHIMAZOLE 10 MG PO TABS: 40.0000 mg | ORAL_TABLET | Freq: Two times a day (BID) | ORAL | 2 refills | Status: DC

## 2017-05-23 ENCOUNTER — Other Ambulatory Visit: Payer: Self-pay

## 2017-05-23 MED ORDER — METHIMAZOLE 10 MG PO TABS: 40.0000 mg | ORAL_TABLET | Freq: Two times a day (BID) | ORAL | 2 refills | Status: DC

## 2017-05-31 ENCOUNTER — Encounter: Payer: Self-pay | Admitting: Emergency Medicine

## 2017-05-31 ENCOUNTER — Ambulatory Visit (INDEPENDENT_AMBULATORY_CARE_PROVIDER_SITE_OTHER): Payer: BLUE CROSS/BLUE SHIELD | Admitting: Endocrinology

## 2017-05-31 ENCOUNTER — Encounter: Payer: Self-pay | Admitting: Endocrinology

## 2017-05-31 ENCOUNTER — Ambulatory Visit (INDEPENDENT_AMBULATORY_CARE_PROVIDER_SITE_OTHER): Payer: BLUE CROSS/BLUE SHIELD | Admitting: Emergency Medicine

## 2017-05-31 VITALS — BP 140/70 | HR 56 | Temp 98.4°F | Resp 16 | Ht 68.25 in | Wt 193.8 lb

## 2017-05-31 VITALS — BP 132/78 | HR 58 | Wt 195.2 lb

## 2017-05-31 DIAGNOSIS — I10 Essential (primary) hypertension: Secondary | ICD-10-CM

## 2017-05-31 DIAGNOSIS — N529 Male erectile dysfunction, unspecified: Secondary | ICD-10-CM

## 2017-05-31 DIAGNOSIS — E0590 Thyrotoxicosis, unspecified without thyrotoxic crisis or storm: Secondary | ICD-10-CM

## 2017-05-31 DIAGNOSIS — E785 Hyperlipidemia, unspecified: Secondary | ICD-10-CM | POA: Diagnosis not present

## 2017-05-31 LAB — T4, FREE: Free T4: 0.48 ng/dL — ABNORMAL LOW (ref 0.60–1.60)

## 2017-05-31 LAB — TSH: TSH: 0.2 u[IU]/mL — AB (ref 0.35–4.50)

## 2017-05-31 MED ORDER — METHIMAZOLE 10 MG PO TABS: 20.0000 mg | ORAL_TABLET | Freq: Two times a day (BID) | ORAL | 3 refills | Status: DC

## 2017-05-31 MED ORDER — SILDENAFIL CITRATE 100 MG PO TABS: 50.0000 mg | ORAL_TABLET | Freq: Every day | ORAL | 11 refills | Status: DC | PRN

## 2017-05-31 MED ORDER — ATORVASTATIN CALCIUM 40 MG PO TABS: 40.0000 mg | ORAL_TABLET | Freq: Every day | ORAL | 3 refills | Status: DC

## 2017-05-31 MED ORDER — LISINOPRIL-HYDROCHLOROTHIAZIDE 20-25 MG PO TABS: 1.0000 | ORAL_TABLET | Freq: Every day | ORAL | 3 refills | Status: DC

## 2017-05-31 NOTE — Progress Notes (Signed)
   Subjective:    Patient ID: Donald Zhang, male    DOB: Mar 23, 1956, 61 y.o.   MRN: [id number]  HPI Pt returns for f/u of hyperthyroidism (dx'ed early 2018; he has never had dedicated thyroid imaging; he chose tapazole rx).  pt states he feels well in general.  In particular, he has regained weight.  Past Medical History:  Diagnosis Date  . Essential hypertension   . Hyperlipidemia     Past Surgical History:  Procedure Laterality Date  . CYSTOSCOPY/RETROGRADE/URETEROSCOPY/STONE EXTRACTION WITH BASKET Right 02/04/2015   Procedure: CYSTOSCOPY/RETROGRADE/URETEROSCOPY/STONE EXTRACTION WITH BASKET/STENT PLACEMENT RIGHT;  Surgeon: Rana Snare, MD;  Location: [facility];  Service: Urology;  Laterality: Right;  . HOLMIUM LASER APPLICATION Right 9/48/5462   Procedure: HOLMIUM LASER APPLICATION;  Surgeon: Rana Snare, MD;  Location: [facility];  Service: Urology;  Laterality: Right;    Social History   Social History  . Marital status: Married    Spouse name: N/A  . Number of children: N/A  . Years of education: N/A   Occupational History  . Not on file.   Social History Main Topics  . Smoking status: Current Every Day Smoker    Start date: 07/17/1974  . Smokeless tobacco: Never Used  . Alcohol use 3.6 oz/week    6 Cans of beer per week  . Drug use: No  . Sexual activity: Yes   Other Topics Concern  . Not on file   Social History Narrative  . No narrative on file    Current Outpatient Prescriptions on File Prior to Visit  Medication Sig Dispense Refill  . aspirin EC 81 MG tablet Take 1 tablet (81 mg total) by mouth daily.     No current facility-administered medications on file prior to visit.     No Known Allergies  Family History  Problem Relation Age of Onset  . Hypertension Mother   . Diabetes Father   . Diabetes Brother   . Thyroid disease Neg Hx     BP 132/78   Pulse (!) 58   Wt 195 lb 3.2 oz (88.5 kg)   SpO2 97%   BMI 28.83 kg/m    Review of  Systems Denies fever.     Objective:   Physical Exam VS: see vs page GEN: no distress. eyes: no periorbital swelling; moderate bilat proptosis.  NECK: thyroid is approx 5 times normal size, bilateral. No palpable nodule.        Assessment & Plan:  Hyperthyroidism: due for recheck  Patient Instructions  blood tests are requested for you today.  We'll let you know about the results. If ever you have fever while taking methimazole, stop it and call us, even if the reason is obvious, because of the risk of a rare side-effect.   Please come back for a follow-up appointment in 2 months.

## 2017-05-31 NOTE — Patient Instructions (Addendum)
     IF you received an x-ray today, you will receive an invoice from [facility]. Please contact [facility] at [phone] with questions or concerns regarding your invoice.   IF you received labwork today, you will receive an invoice from [facility]. Please contact [facility] at [phone] with questions or concerns regarding your invoice.   Our billing staff will not be able to assist you with questions regarding bills from these companies.  You will be contacted with the lab results as soon as they are available. The fastest way to get your results is to activate your My Chart account. Instructions are located on the last page of this paperwork. If you have not heard from [place] regarding the results in 2 weeks, please contact this office.     Cholesterol Cholesterol is a fat. Your body needs a small amount of cholesterol. Cholesterol (plaque) may build up in your blood vessels (arteries). That makes you more likely to have a heart attack or stroke. You cannot feel your cholesterol level. Having a blood test is the only way to find out if your level is high. Keep your test results. Work with your doctor to keep your cholesterol at a good level. What do the results mean?  Total cholesterol is how much cholesterol is in your blood.  LDL is bad cholesterol. This is the type that can build up. Try to have low LDL.  HDL is good cholesterol. It cleans your blood vessels and carries LDL away. Try to have high HDL.  Triglycerides are fat that the body can store or burn for energy. What are good levels of cholesterol?  Total cholesterol below 200.  LDL below 100 is good for people who have health risks. LDL below 70 is good for people who have very high risks.  HDL above 40 is good. It is best to have HDL of 60 or higher.  Triglycerides below 150. How can I lower my cholesterol? Diet Follow your diet program as told by your doctor.  Choose fish, white meat chicken, or  [place] that is roasted or baked. Try not to eat red meat, fried foods, sausage, or lunch meats.  Eat lots of fresh fruits and vegetables.  Choose whole grains, beans, pasta, potatoes, and cereals.  Choose olive oil, corn oil, or canola oil. Only use small amounts.  Try not to eat butter, mayonnaise, shortening, or palm kernel oils.  Try not to eat foods with trans fats.  Choose low-fat or nonfat dairy foods. ? Drink skim or nonfat milk. ? Eat low-fat or nonfat yogurt and cheeses. ? Try not to drink whole milk or cream. ? Try not to eat ice cream, egg yolks, or full-fat cheeses.  Healthy desserts include angel food cake, ginger snaps, animal crackers, hard candy, popsicles, and low-fat or nonfat frozen yogurt. Try not to eat pastries, cakes, pies, and cookies.  Exercise Follow your exercise program as told by your doctor.  Be more active. Try gardening, walking, and taking the stairs.  Ask your doctor about ways that you can be more active.  Medicine  Take over-the-counter and prescription medicines only as told by your doctor.  This information is not intended to replace advice given to you by your health care provider. Make sure you discuss any questions you have with your health care provider. Document Released: 12/07/2008 Document Revised: 04/11/2016 Document Reviewed: 03/22/2016 Elsevier Interactive Patient Education  2017 [employer].

## 2017-05-31 NOTE — Progress Notes (Signed)
Donald Zhang 61 y.o.   Chief Complaint  Patient presents with  . Medication Refill    ATORVASTATIN    HISTORY OF PRESENT ILLNESS: This is a 61 y.o. male with h/o dyslipidemia, doing well but needs Lipitor refill; also c/o ED and requesting Viagra. No other complaints or medical concerns; saw endocrinologist this am and had blood work done.  HPI   Prior to Admission medications   Medication Sig Start Date End Date Taking? Authorizing Provider  aspirin EC 81 MG tablet Take 1 tablet (81 mg total) by mouth daily. 06/13/16  Yes Shawnee Knapp, MD  lisinopril-hydrochlorothiazide (PRINZIDE,ZESTORETIC) 20-25 MG tablet Take 1 tablet by mouth daily. NEEDS OFFICE VISIT FOR REFILLS 05/15/17  Yes Wardell Honour, MD  methimazole (TAPAZOLE) 10 MG tablet Take 4 tablets (40 mg total) by mouth 2 (two) times daily. 05/23/17  Yes Renato Shin, MD  atorvastatin (LIPITOR) 40 MG tablet Take 1 tablet (40 mg total) by mouth daily. 05/21/16 05/21/17  Shawnee Knapp, MD    No Known Allergies  Patient Active Problem List   Diagnosis Date Noted  . Hyperthyroidism 02/16/2017  . Ureteral calculus 02/04/2015  . Tobacco dependence 01/28/2012  . Hypertension 12/19/2011  . Dyslipidemia 12/19/2011    Past Medical History:  Diagnosis Date  . Essential hypertension   . Hyperlipidemia     Past Surgical History:  Procedure Laterality Date  . CYSTOSCOPY/RETROGRADE/URETEROSCOPY/STONE EXTRACTION WITH BASKET Right 02/04/2015   Procedure: CYSTOSCOPY/RETROGRADE/URETEROSCOPY/STONE EXTRACTION WITH BASKET/STENT PLACEMENT RIGHT;  Surgeon: Rana Snare, MD;  Location: [facility];  Service: Urology;  Laterality: Right;  . HOLMIUM LASER APPLICATION Right 4/40/3474   Procedure: HOLMIUM LASER APPLICATION;  Surgeon: Rana Snare, MD;  Location: [facility];  Service: Urology;  Laterality: Right;    Social History   Social History  . Marital status: Married    Spouse name: N/A  . Number of children: N/A  . Years of education: N/A    Occupational History  . Not on file.   Social History Main Topics  . Smoking status: Current Every Day Smoker    Start date: 07/17/1974  . Smokeless tobacco: Never Used  . Alcohol use 3.6 oz/week    6 Cans of beer per week  . Drug use: No  . Sexual activity: Yes   Other Topics Concern  . Not on file   Social History Narrative  . No narrative on file    Family History  Problem Relation Age of Onset  . Hypertension Mother   . Diabetes Father   . Diabetes Brother   . Thyroid disease Neg Hx      Review of Systems  Constitutional: Negative.  Negative for chills, fever and weight loss.  HENT: Negative.  Negative for congestion, hearing loss, nosebleeds and sore throat.   Eyes: Negative.  Negative for blurred vision and double vision.  Respiratory: Negative.  Negative for cough and shortness of breath.   Cardiovascular: Negative.  Negative for chest pain, palpitations, claudication and leg swelling.  Gastrointestinal: Negative.  Negative for abdominal pain, constipation, diarrhea, nausea and vomiting.  Genitourinary: Negative.  Negative for dysuria and hematuria.       ED  Musculoskeletal: Negative.  Negative for back pain, myalgias and neck pain.  Skin: Negative.  Negative for rash.  Neurological: Negative.  Negative for dizziness, sensory change, focal weakness and headaches.  Endo/Heme/Allergies: Negative.   All other systems reviewed and are negative.  Vitals:   05/31/17 0931 05/31/17 0936  BP: Marland Kitchen)  152/78 140/70  Pulse: (!) 56   Resp: 16   Temp: 98.4 F (36.9 C)   SpO2: 98%      Physical Exam  Constitutional: He is oriented to person, place, and time. He appears well-developed and well-nourished.  HENT:  Head: Normocephalic and atraumatic.  Right Ear: External ear normal.  Left Ear: External ear normal.  Nose: Nose normal.  Mouth/Throat: Oropharynx is clear and moist.  Eyes: Pupils are equal, round, and reactive to light. Conjunctivae and EOM are normal.   Neck: Normal range of motion. Neck supple. No JVD present. No thyromegaly present.  Cardiovascular: Normal rate, regular rhythm, normal heart sounds and intact distal pulses.   Pulmonary/Chest: Effort normal and breath sounds normal.  Abdominal: Soft. There is no tenderness.  Musculoskeletal: Normal range of motion. He exhibits no edema or tenderness.  Lymphadenopathy:    He has no cervical adenopathy.  Neurological: He is alert and oriented to person, place, and time. No sensory deficit. He exhibits normal muscle tone.  Skin: Skin is warm and dry. Capillary refill takes less than 2 seconds. No rash noted.  Psychiatric: He has a normal mood and affect. His behavior is normal.  Vitals reviewed.    ASSESSMENT & PLAN: Donald Zhang was seen today for medication refill.  Diagnoses and all orders for this visit:  Hyperlipidemia, unspecified hyperlipidemia type -     atorvastatin (LIPITOR) 40 MG tablet; Take 1 tablet (40 mg total) by mouth daily.  Erectile dysfunction, unspecified erectile dysfunction type -     sildenafil (VIAGRA) 100 MG tablet; Take 0.5-1 tablets (50-100 mg total) by mouth daily as needed for erectile dysfunction.  Hyperthyroidism  Essential hypertension -     lisinopril-hydrochlorothiazide (PRINZIDE,ZESTORETIC) 20-25 MG tablet; Take 1 tablet by mouth daily. NEEDS OFFICE VISIT FOR REFILLS    F/U in 6 months.  Patient Instructions       IF you received an x-ray today, you will receive an invoice from [facility]. Please contact [facility] at [phone] with questions or concerns regarding your invoice.   IF you received labwork today, you will receive an invoice from [facility]. Please contact [facility] at [phone] with questions or concerns regarding your invoice.   Our billing staff will not be able to assist you with questions regarding bills from these companies.  You will be contacted with the lab results as soon as they are available.  The fastest way to get your results is to activate your My Chart account. Instructions are located on the last page of this paperwork. If you have not heard from [place] regarding the results in 2 weeks, please contact this office.     Cholesterol Cholesterol is a fat. Your body needs a small amount of cholesterol. Cholesterol (plaque) may build up in your blood vessels (arteries). That makes you more likely to have a heart attack or stroke. You cannot feel your cholesterol level. Having a blood test is the only way to find out if your level is high. Keep your test results. Work with your doctor to keep your cholesterol at a good level. What do the results mean?  Total cholesterol is how much cholesterol is in your blood.  LDL is bad cholesterol. This is the type that can build up. Try to have low LDL.  HDL is good cholesterol. It cleans your blood vessels and carries LDL away. Try to have high HDL.  Triglycerides are fat that the body can store or burn for energy. What are good  levels of cholesterol?  Total cholesterol below 200.  LDL below 100 is good for people who have health risks. LDL below 70 is good for people who have very high risks.  HDL above 40 is good. It is best to have HDL of 60 or higher.  Triglycerides below 150. How can I lower my cholesterol? Diet Follow your diet program as told by your doctor.  Choose fish, white meat chicken, or [place] that is roasted or baked. Try not to eat red meat, fried foods, sausage, or lunch meats.  Eat lots of fresh fruits and vegetables.  Choose whole grains, beans, pasta, potatoes, and cereals.  Choose olive oil, corn oil, or canola oil. Only use small amounts.  Try not to eat butter, mayonnaise, shortening, or palm kernel oils.  Try not to eat foods with trans fats.  Choose low-fat or nonfat dairy foods. ? Drink skim or nonfat milk. ? Eat low-fat or nonfat yogurt and cheeses. ? Try not to drink whole milk or cream. ? Try not  to eat ice cream, egg yolks, or full-fat cheeses.  Healthy desserts include angel food cake, ginger snaps, animal crackers, hard candy, popsicles, and low-fat or nonfat frozen yogurt. Try not to eat pastries, cakes, pies, and cookies.  Exercise Follow your exercise program as told by your doctor.  Be more active. Try gardening, walking, and taking the stairs.  Ask your doctor about ways that you can be more active.  Medicine  Take over-the-counter and prescription medicines only as told by your doctor.  This information is not intended to replace advice given to you by your health care provider. Make sure you discuss any questions you have with your health care provider. Document Released: 12/07/2008 Document Revised: 04/11/2016 Document Reviewed: 03/22/2016 Elsevier Interactive Patient Education  2017 Elsevier Inc.      Agustina Caroli, MD [facility]

## 2017-05-31 NOTE — Patient Instructions (Addendum)
blood tests are requested for you today.  We'll let you know about the results.   If ever you have fever while taking methimazole, stop it and call us, even if the reason is obvious, because of the risk of a rare side-effect.  Please come back for a follow-up appointment in 2 months.   

## 2017-07-31 ENCOUNTER — Encounter: Payer: Self-pay | Admitting: Endocrinology

## 2017-07-31 ENCOUNTER — Ambulatory Visit (INDEPENDENT_AMBULATORY_CARE_PROVIDER_SITE_OTHER): Payer: BLUE CROSS/BLUE SHIELD | Admitting: Endocrinology

## 2017-07-31 VITALS — BP 152/84 | HR 73 | Wt 206.0 lb

## 2017-07-31 DIAGNOSIS — E0590 Thyrotoxicosis, unspecified without thyrotoxic crisis or storm: Secondary | ICD-10-CM | POA: Diagnosis not present

## 2017-07-31 LAB — TSH: TSH: 12.24 u[IU]/mL — AB (ref 0.35–4.50)

## 2017-07-31 LAB — T4, FREE: FREE T4: 0.37 ng/dL — AB (ref 0.60–1.60)

## 2017-07-31 MED ORDER — METHIMAZOLE 10 MG PO TABS: 10.0000 mg | ORAL_TABLET | Freq: Two times a day (BID) | ORAL | 3 refills | Status: DC

## 2017-07-31 NOTE — Progress Notes (Signed)
   Subjective:    Patient ID: Donald Zhang, male    DOB: 01-Sep-1956, 61 y.o.   MRN: [id number]  HPI Pt returns for f/u of hyperthyroidism (dx'ed early 2018; he has never had dedicated thyroid imaging; he chose tapazole rx).  pt states he feels well in general.  He takes tapazole as rx'ed.  Past Medical History:  Diagnosis Date  . Essential hypertension   . Hyperlipidemia     History reviewed. No pertinent surgical history.  Social History   Socioeconomic History  . Marital status: Married    Spouse name: Not on file  . Number of children: Not on file  . Years of education: Not on file  . Highest education level: Not on file  Social Needs  . Financial resource strain: Not on file  . Food insecurity - worry: Not on file  . Food insecurity - inability: Not on file  . Transportation needs - medical: Not on file  . Transportation needs - non-medical: Not on file  Occupational History  . Not on file  Tobacco Use  . Smoking status: Current Every Day Smoker    Start date: 07/17/1974  . Smokeless tobacco: Never Used  Substance and Sexual Activity  . Alcohol use: Yes    Alcohol/week: 3.6 oz    Types: 6 Cans of beer per week  . Drug use: No  . Sexual activity: Yes  Other Topics Concern  . Not on file  Social History Narrative  . Not on file    Current Outpatient Medications on File Prior to Visit  Medication Sig Dispense Refill  . aspirin EC 81 MG tablet Take 1 tablet (81 mg total) by mouth daily.    Marland Kitchen atorvastatin (LIPITOR) 40 MG tablet Take 1 tablet (40 mg total) by mouth daily. 90 tablet 3  . lisinopril-hydrochlorothiazide (PRINZIDE,ZESTORETIC) 20-25 MG tablet Take 1 tablet by mouth daily. NEEDS OFFICE VISIT FOR REFILLS 90 tablet 3  . sildenafil (VIAGRA) 100 MG tablet Take 0.5-1 tablets (50-100 mg total) by mouth daily as needed for erectile dysfunction. 10 tablet 11   No current facility-administered medications on file prior to visit.     No Known  Allergies  Family History  Problem Relation Age of Onset  . Hypertension Mother   . Diabetes Father   . Diabetes Brother   . Thyroid disease Neg Hx     BP (!) 152/84 (BP Location: Left Arm, Patient Position: Sitting, Cuff Size: Normal)   Pulse 73   Wt 206 lb (93.4 kg)   SpO2 96%   BMI 31.09 kg/m    Review of Systems Denies fever.      Objective:   Physical Exam VS: see vs page.  GEN: no distress. eyes: no periorbital swelling; moderate bilat proptosis.  NECK: thyroid is approx 5 times normal size (R>L). No palpable nodule.       Assessment & Plan:  Hyperthyroidism: due for recheck  Patient Instructions  blood tests are requested for you today.  We'll let you know about the results. If ever you have fever while taking methimazole, stop it and call us, even if the reason is obvious, because of the risk of a rare side-effect.   Please come back for a follow-up appointment in 3 months.

## 2017-07-31 NOTE — Patient Instructions (Addendum)
blood tests are requested for you today.  We'll let you know about the results. If ever you have fever while taking methimazole, stop it and call us, even if the reason is obvious, because of the risk of a rare side-effect.  Please come back for a follow-up appointment in 3 months.   

## 2017-10-02 ENCOUNTER — Ambulatory Visit (INDEPENDENT_AMBULATORY_CARE_PROVIDER_SITE_OTHER): Payer: BLUE CROSS/BLUE SHIELD | Admitting: Endocrinology

## 2017-10-02 ENCOUNTER — Encounter: Payer: Self-pay | Admitting: Endocrinology

## 2017-10-02 VITALS — BP 162/90 | HR 68 | Wt 202.2 lb

## 2017-10-02 DIAGNOSIS — E0590 Thyrotoxicosis, unspecified without thyrotoxic crisis or storm: Secondary | ICD-10-CM

## 2017-10-02 LAB — TSH: TSH: 3.43 u[IU]/mL (ref 0.35–4.50)

## 2017-10-02 LAB — T4, FREE: Free T4: 0.73 ng/dL (ref 0.60–1.60)

## 2017-10-02 MED ORDER — METHIMAZOLE 10 MG PO TABS: 10.0000 mg | ORAL_TABLET | Freq: Every day | ORAL | 5 refills | Status: DC

## 2017-10-02 NOTE — Progress Notes (Signed)
Subjective:    Patient ID: Donald Zhang, male    DOB: [birth year], 62 y.o.   MRN: [id number]  HPI Pt returns for f/u of hyperthyroidism (dx'ed early 2018; he has never had dedicated thyroid imaging; he chose tapazole rx).  pt states he feels well in general.  He takes tapazole 10 mg qd.   Past Medical History:  Diagnosis Date  . Essential hypertension   . Hyperlipidemia     Past Surgical History:  Procedure Laterality Date  . CYSTOSCOPY/RETROGRADE/URETEROSCOPY/STONE EXTRACTION WITH BASKET Right 02/04/2015   Procedure: CYSTOSCOPY/RETROGRADE/URETEROSCOPY/STONE EXTRACTION WITH BASKET/STENT PLACEMENT RIGHT;  Surgeon: Rana Snare, MD;  Location: [facility];  Service: Urology;  Laterality: Right;  . HOLMIUM LASER APPLICATION Right 3/71/6967   Procedure: HOLMIUM LASER APPLICATION;  Surgeon: Rana Snare, MD;  Location: [facility];  Service: Urology;  Laterality: Right;    Social History   Socioeconomic History  . Marital status: Married    Spouse name: Not on file  . Number of children: Not on file  . Years of education: Not on file  . Highest education level: Not on file  Social Needs  . Financial resource strain: Not on file  . Food insecurity - worry: Not on file  . Food insecurity - inability: Not on file  . Transportation needs - medical: Not on file  . Transportation needs - non-medical: Not on file  Occupational History  . Not on file  Tobacco Use  . Smoking status: Current Every Day Smoker    Start date: 07/17/1974  . Smokeless tobacco: Never Used  Substance and Sexual Activity  . Alcohol use: Yes    Alcohol/week: 3.6 oz    Types: 6 Cans of beer per week  . Drug use: No  . Sexual activity: Yes  Other Topics Concern  . Not on file  Social History Narrative  . Not on file    Current Outpatient Medications on File Prior to Visit  Medication Sig Dispense Refill  . aspirin EC 81 MG tablet Take 1 tablet (81 mg total) by mouth daily.    Marland Kitchen atorvastatin (LIPITOR) 40 MG  tablet Take 1 tablet (40 mg total) by mouth daily. 90 tablet 3  . lisinopril-hydrochlorothiazide (PRINZIDE,ZESTORETIC) 20-25 MG tablet Take 1 tablet by mouth daily. NEEDS OFFICE VISIT FOR REFILLS 90 tablet 3  . sildenafil (VIAGRA) 100 MG tablet Take 0.5-1 tablets (50-100 mg total) by mouth daily as needed for erectile dysfunction. 10 tablet 11   No current facility-administered medications on file prior to visit.     No Known Allergies  Family History  Problem Relation Age of Onset  . Hypertension Mother   . Diabetes Father   . Diabetes Brother   . Thyroid disease Neg Hx     BP (!) 162/90 (BP Location: Left Arm, Patient Position: Sitting, Cuff Size: Normal)   Pulse 68   Wt 202 lb 3.2 oz (91.7 kg)   SpO2 96%   BMI 30.52 kg/m    Review of Systems Denies fever.     Objective:   Physical Exam VS: see vs page.  GEN: no distress. eyes: no periorbital swelling; moderate bilat proptosis.  NECK: thyroid is approx 5 times normal size (R>L). No palpable nodule.       Assessment & Plan:  Hyperthyroidism, due for recheck.   Patient Instructions  blood tests are requested for you today.  We'll let you know about the results. If ever you have fever while taking methimazole, stop it and  call us, even if the reason is obvious, because of the risk of a rare side-effect.   Please come back for a follow-up appointment in 4 months.

## 2017-10-02 NOTE — Patient Instructions (Addendum)
blood tests are requested for you today.  We'll let you know about the results. If ever you have fever while taking methimazole, stop it and call us, even if the reason is obvious, because of the risk of a rare side-effect. Please come back for a follow-up appointment in 4 months.  

## 2017-10-31 ENCOUNTER — Ambulatory Visit: Payer: BLUE CROSS/BLUE SHIELD | Admitting: Endocrinology

## 2017-11-29 ENCOUNTER — Ambulatory Visit: Payer: BLUE CROSS/BLUE SHIELD | Admitting: Emergency Medicine

## 2017-12-03 ENCOUNTER — Ambulatory Visit: Payer: BLUE CROSS/BLUE SHIELD | Admitting: Emergency Medicine

## 2017-12-03 ENCOUNTER — Encounter: Payer: Self-pay | Admitting: Emergency Medicine

## 2017-12-03 VITALS — BP 134/74 | HR 98 | Temp 98.3°F | Resp 17 | Ht 69.5 in | Wt 200.0 lb

## 2017-12-03 DIAGNOSIS — E0590 Thyrotoxicosis, unspecified without thyrotoxic crisis or storm: Secondary | ICD-10-CM | POA: Diagnosis not present

## 2017-12-03 DIAGNOSIS — I10 Essential (primary) hypertension: Secondary | ICD-10-CM | POA: Diagnosis not present

## 2017-12-03 DIAGNOSIS — Z1211 Encounter for screening for malignant neoplasm of colon: Secondary | ICD-10-CM

## 2017-12-03 DIAGNOSIS — E785 Hyperlipidemia, unspecified: Secondary | ICD-10-CM | POA: Diagnosis not present

## 2017-12-03 MED ORDER — LISINOPRIL-HYDROCHLOROTHIAZIDE 20-25 MG PO TABS: 1.0000 | ORAL_TABLET | Freq: Every day | ORAL | 3 refills | Status: DC

## 2017-12-03 MED ORDER — ATORVASTATIN CALCIUM 40 MG PO TABS: 40.0000 mg | ORAL_TABLET | Freq: Every day | ORAL | 3 refills | Status: DC

## 2017-12-03 NOTE — Progress Notes (Signed)
Donald Zhang 62 y.o.   Chief Complaint  Patient presents with  . Medication Refill    prinizide    HISTORY OF PRESENT ILLNESS: This is a 61 y.o. male here for follow-up of high blood pressure and high cholesterol.  Needs medication refills.  Compliant with medications.  Still smoking a little bit.  No EtOH abuse.  Eating well.  Here for blood work as well.  Has no complaints at this time.  However last week was having pain to the left knee.  Used OTC medications with relief.  Today much better than last week.  HPI   Prior to Admission medications   Medication Sig Start Date End Date Taking? Authorizing Provider  aspirin EC 81 MG tablet Take 1 tablet (81 mg total) by mouth daily. 06/13/16  Yes Donald Knapp, MD  atorvastatin (LIPITOR) 40 MG tablet Take 1 tablet (40 mg total) by mouth daily. 05/31/17 05/31/18 Yes Donald Zhang, Donald Bloomer, MD  lisinopril-hydrochlorothiazide (PRINZIDE,ZESTORETIC) 20-25 MG tablet Take 1 tablet by mouth daily. NEEDS OFFICE VISIT FOR REFILLS 05/31/17  Yes Donald Zhang, Donald Bloomer, MD  methimazole (TAPAZOLE) 10 MG tablet Take 1 tablet (10 mg total) by mouth daily. 10/02/17  Yes Donald Shin, MD  sildenafil (VIAGRA) 100 MG tablet Take 0.5-1 tablets (50-100 mg total) by mouth daily as needed for erectile dysfunction. 05/31/17  Yes Donald Bloomer, MD    No Known Allergies  Patient Active Problem List   Diagnosis Date Noted  . Hyperlipidemia 05/31/2017  . Erectile dysfunction 05/31/2017  . Hyperthyroidism 02/16/2017  . Ureteral calculus 02/04/2015  . Tobacco dependence 01/28/2012  . Essential hypertension 12/19/2011  . Dyslipidemia 12/19/2011    Past Medical History:  Diagnosis Date  . Essential hypertension   . Hyperlipidemia     Past Surgical History:  Procedure Laterality Date  . CYSTOSCOPY/RETROGRADE/URETEROSCOPY/STONE EXTRACTION WITH BASKET Right 02/04/2015   Procedure: CYSTOSCOPY/RETROGRADE/URETEROSCOPY/STONE EXTRACTION WITH BASKET/STENT PLACEMENT  RIGHT;  Surgeon: Donald Snare, MD;  Location: [facility];  Service: Urology;  Laterality: Right;  . HOLMIUM LASER APPLICATION Right 5/36/6440   Procedure: HOLMIUM LASER APPLICATION;  Surgeon: Donald Snare, MD;  Location: [facility];  Service: Urology;  Laterality: Right;    Social History   Socioeconomic History  . Marital status: Married    Spouse name: Not on file  . Number of children: Not on file  . Years of education: Not on file  . Highest education level: Not on file  Social Needs  . Financial resource strain: Not on file  . Food insecurity - worry: Not on file  . Food insecurity - inability: Not on file  . Transportation needs - medical: Not on file  . Transportation needs - non-medical: Not on file  Occupational History  . Not on file  Tobacco Use  . Smoking status: Current Every Day Smoker    Packs/day: 0.30    Years: 40.00    Pack years: 12.00    Start date: 07/17/1974  . Smokeless tobacco: Never Used  Substance and Sexual Activity  . Alcohol use: Yes    Alcohol/week: 3.6 oz    Types: 6 Cans of beer per week  . Drug use: No  . Sexual activity: Yes  Other Topics Concern  . Not on file  Social History Narrative  . Not on file    Family History  Problem Relation Age of Onset  . Hypertension Mother   . Diabetes Father   . Diabetes Brother   . Thyroid disease Neg  Hx      Review of Systems  Constitutional: Negative.  Negative for chills, fever and malaise/fatigue.  HENT: Negative.  Negative for congestion, hearing loss, nosebleeds and sore throat.   Eyes: Negative.  Negative for blurred vision, double vision and pain.  Respiratory: Negative.  Negative for cough, shortness of breath and wheezing.   Cardiovascular: Negative.  Negative for chest pain, palpitations and leg swelling.  Gastrointestinal: Negative.  Negative for abdominal pain, blood in stool, diarrhea, nausea and vomiting.  Genitourinary: Negative.  Negative for dysuria and hematuria.  Musculoskeletal:  Negative.  Negative for back pain, myalgias and neck pain.  Skin: Negative.  Negative for rash.  Neurological: Negative.  Negative for dizziness and headaches.  Endo/Heme/Allergies: Negative.   All other systems reviewed and are negative.   Vitals:   12/03/17 0809  BP: 134/74  Pulse: 98  Resp: 17  Temp: 98.3 F (36.8 C)  SpO2: 98%    Physical Exam  Constitutional: He is oriented to person, place, and time. He appears well-developed and well-nourished.  HENT:  Head: Normocephalic and atraumatic.  Nose: Nose normal.  Mouth/Throat: Oropharynx is clear and moist.  Eyes: Conjunctivae and EOM are normal. Pupils are equal, round, and reactive to light.  Neck: Normal range of motion. Neck supple. No JVD present. No thyromegaly present.  Cardiovascular: Normal rate, regular rhythm and normal heart sounds.  Pulmonary/Chest: Effort normal and breath sounds normal.  Abdominal: Soft. Bowel sounds are normal. He exhibits no distension. There is no tenderness.  Musculoskeletal:  Left knee: Full range of motion.  No erythema or swelling.  No tenderness.  Stable in flexion and extension.  Overall exam within normal limits.  Lymphadenopathy:    He has no cervical adenopathy.  Neurological: He is alert and oriented to person, place, and time.  Skin: Skin is warm and dry. Capillary refill takes less than 2 seconds.  Psychiatric: He has a normal mood and affect. His behavior is normal.  Vitals reviewed.  Hyperthyroidism Taking 1 tablet of Tapazole daily.  Doing well.  Sees endocrinologist regularly.  Essential hypertension Well-controlled on medication.  Will continue same management.  Dyslipidemia Compliant with Lipitor.  Fasting today.  We will do labs.  Continue same management.    ASSESSMENT & PLAN: Donald Zhang was seen today for medication refill.  Diagnoses and all orders for this visit:  Essential hypertension -     lisinopril-hydrochlorothiazide (PRINZIDE,ZESTORETIC) 20-25 MG  tablet; Take 1 tablet by mouth daily. NEEDS OFFICE VISIT FOR REFILLS -     CBC with Differential/Platelet -     Comprehensive metabolic panel -     Hemoglobin A1c  Hyperthyroidism -     CBC with Differential/Platelet  Dyslipidemia -     Lipid panel -     Hemoglobin A1c  Hyperlipidemia, unspecified hyperlipidemia type -     atorvastatin (LIPITOR) 40 MG tablet; Take 1 tablet (40 mg total) by mouth daily.  Colon cancer screening -     Ambulatory referral to Gastroenterology    Patient Instructions       IF you received an x-ray today, you will receive an invoice from [facility]. Please contact [facility] at [phone] with questions or concerns regarding your invoice.   IF you received labwork today, you will receive an invoice from [facility]. Please contact [facility] at [phone] with questions or concerns regarding your invoice.   Our billing staff will not be able to assist you with questions regarding bills from these companies.  You will be contacted with the lab results as soon as they are available. The fastest way to get your results is to activate your My Chart account. Instructions are located on the last page of this paperwork. If you have not heard from [place] regarding the results in 2 weeks, please contact this office.      Health Maintenance, Male A healthy lifestyle and preventive care is important for your health and wellness. Ask your health care provider about what schedule of regular examinations is right for you. What should I know about weight and diet? Eat a Healthy Diet  Eat plenty of vegetables, fruits, whole grains, low-fat dairy products, and lean protein.  Do not eat a lot of foods high in solid fats, added sugars, or salt.  Maintain a Healthy Weight Regular exercise can help you achieve or maintain a healthy weight. You should:  Do at least 150 minutes of exercise each week. The exercise should increase your heart rate and  make you sweat (moderate-intensity exercise).  Do strength-training exercises at least twice a week.  Watch Your Levels of Cholesterol and Blood Lipids  Have your blood tested for lipids and cholesterol every 5 years starting at 61 years of age. If you are at high risk for heart disease, you should start having your blood tested when you are 61 years old. You may need to have your cholesterol levels checked more often if: ? Your lipid or cholesterol levels are high. ? You are older than 61 years of age. ? You are at high risk for heart disease.  What should I know about cancer screening? Many types of cancers can be detected early and may often be prevented. Lung Cancer  You should be screened every year for lung cancer if: ? You are a current smoker who has smoked for at least 30 years. ? You are a former smoker who has quit within the past 15 years.  Talk to your health care provider about your screening options, when you should start screening, and how often you should be screened.  Colorectal Cancer  Routine colorectal cancer screening usually begins at 61 years of age and should be repeated every 5-10 years until you are 61 years old. You may need to be screened more often if early forms of precancerous polyps or small growths are found. Your health care provider may recommend screening at an earlier age if you have risk factors for colon cancer.  Your health care provider may recommend using home test kits to check for hidden blood in the stool.  A small camera at the end of a tube can be used to examine your colon (sigmoidoscopy or colonoscopy). This checks for the earliest forms of colorectal cancer.  Prostate and Testicular Cancer  Depending on your age and overall health, your health care provider may do certain tests to screen for prostate and testicular cancer.  Talk to your health care provider about any symptoms or concerns you have about testicular or prostate  cancer.  Skin Cancer  Check your skin from head to toe regularly.  Tell your health care provider about any new moles or changes in moles, especially if: ? There is a change in a mole's size, shape, or color. ? You have a mole that is larger than a pencil eraser.  Always use sunscreen. Apply sunscreen liberally and repeat throughout the day.  Protect yourself by wearing long sleeves, pants, a wide-brimmed hat, and sunglasses when outside.  What should  I know about heart disease, diabetes, and high blood pressure?  If you are 25-91 years of age, have your blood pressure checked every 3-5 years. If you are 59 years of age or older, have your blood pressure checked every year. You should have your blood pressure measured twice-once when you are at a hospital or clinic, and once when you are not at a hospital or clinic. Record the average of the two measurements. To check your blood pressure when you are not at a hospital or clinic, you can use: ? An automated blood pressure machine at a pharmacy. ? A home blood pressure monitor.  Talk to your health care provider about your target blood pressure.  If you are between 10-3 years old, ask your health care provider if you should take aspirin to prevent heart disease.  Have regular diabetes screenings by checking your fasting blood sugar level. ? If you are at a normal weight and have a low risk for diabetes, have this test once every three years after the age of 79. ? If you are overweight and have a high risk for diabetes, consider being tested at a younger age or more often.  A one-time screening for abdominal aortic aneurysm (AAA) by ultrasound is recommended for men aged 63-75 years who are current or former smokers. What should I know about preventing infection? Hepatitis B If you have a higher risk for hepatitis B, you should be screened for this virus. Talk with your health care provider to find out if you are at risk for hepatitis B  infection. Hepatitis C Blood testing is recommended for:  Everyone born from [birth year] through 1964.  Anyone with known risk factors for hepatitis C.  Sexually Transmitted Diseases (STDs)  You should be screened each year for STDs including gonorrhea and chlamydia if: ? You are sexually active and are younger than 61 years of age. ? You are older than 61 years of age and your health care provider tells you that you are at risk for this type of infection. ? Your sexual activity has changed since you were last screened and you are at an increased risk for chlamydia or gonorrhea. Ask your health care provider if you are at risk.  Talk with your health care provider about whether you are at high risk of being infected with HIV. Your health care provider may recommend a prescription medicine to help prevent HIV infection.  What else can I do?  Schedule regular health, dental, and eye exams.  Stay current with your vaccines (immunizations).  Do not use any tobacco products, such as cigarettes, chewing tobacco, and e-cigarettes. If you need help quitting, ask your health care provider.  Limit alcohol intake to no more than 2 drinks per day. One drink equals 12 ounces of beer, 5 ounces of wine, or 1 ounces of hard liquor.  Do not use street drugs.  Do not share needles.  Ask your health care provider for help if you need support or information about quitting drugs.  Tell your health care provider if you often feel depressed.  Tell your health care provider if you have ever been abused or do not feel safe at home. This information is not intended to replace advice given to you by your health care provider. Make sure you discuss any questions you have with your health care provider. Document Released: 03/08/2008 Document Revised: 05/09/2016 Document Reviewed: 06/14/2015 Elsevier Interactive Patient Education  2018 [employer].  Hypertension Hypertension, commonly called  high blood  pressure, is when the force of blood pumping through the arteries is too strong. The arteries are the blood vessels that carry blood from the heart throughout the body. Hypertension forces the heart to work harder to pump blood and may cause arteries to become narrow or stiff. Having untreated or uncontrolled hypertension can cause heart attacks, strokes, kidney disease, and other problems. A blood pressure reading consists of a higher number over a lower number. Ideally, your blood pressure should be below 120/80. The first ("top") number is called the systolic pressure. It is a measure of the pressure in your arteries as your heart beats. The second ("bottom") number is called the diastolic pressure. It is a measure of the pressure in your arteries as the heart relaxes. What are the causes? The cause of this condition is not known. What increases the risk? Some risk factors for high blood pressure are under your control. Others are not. Factors you can change  Smoking.  Having type 2 diabetes mellitus, high cholesterol, or both.  Not getting enough exercise or physical activity.  Being overweight.  Having too much fat, sugar, calories, or salt (sodium) in your diet.  Drinking too much alcohol. Factors that are difficult or impossible to change  Having chronic kidney disease.  Having a family history of high blood pressure.  Age. Risk increases with age.  Race. You may be at higher risk if you are African-American.  Gender. Men are at higher risk than women before age 19. After age 65, women are at higher risk than men.  Having obstructive sleep apnea.  Stress. What are the signs or symptoms? Extremely high blood pressure (hypertensive crisis) may cause:  Headache.  Anxiety.  Shortness of breath.  Nosebleed.  Nausea and vomiting.  Severe chest pain.  Jerky movements you cannot control (seizures).  How is this diagnosed? This condition is diagnosed by measuring  your blood pressure while you are seated, with your arm resting on a surface. The cuff of the blood pressure monitor will be placed directly against the skin of your upper arm at the level of your heart. It should be measured at least twice using the same arm. Certain conditions can cause a difference in blood pressure between your right and left arms. Certain factors can cause blood pressure readings to be lower or higher than normal (elevated) for a short period of time:  When your blood pressure is higher when you are in a health care provider's office than when you are at home, this is called white coat hypertension. Most people with this condition do not need medicines.  When your blood pressure is higher at home than when you are in a health care provider's office, this is called masked hypertension. Most people with this condition may need medicines to control blood pressure.  If you have a high blood pressure reading during one visit or you have normal blood pressure with other risk factors:  You may be asked to return on a different day to have your blood pressure checked again.  You may be asked to monitor your blood pressure at home for 1 week or longer.  If you are diagnosed with hypertension, you may have other blood or imaging tests to help your health care provider understand your overall risk for other conditions. How is this treated? This condition is treated by making healthy lifestyle changes, such as eating healthy foods, exercising more, and reducing your alcohol intake. Your health care provider  may prescribe medicine if lifestyle changes are not enough to get your blood pressure under control, and if:  Your systolic blood pressure is above 130.  Your diastolic blood pressure is above 80.  Your personal target blood pressure may vary depending on your medical conditions, your age, and other factors. Follow these instructions at home: Eating and drinking  Eat a diet that  is high in fiber and potassium, and low in sodium, added sugar, and fat. An example eating plan is called the DASH (Dietary Approaches to Stop Hypertension) diet. To eat this way: ? Eat plenty of fresh fruits and vegetables. Try to fill half of your plate at each meal with fruits and vegetables. ? Eat whole grains, such as whole wheat pasta, brown rice, or whole grain bread. Fill about one quarter of your plate with whole grains. ? Eat or drink low-fat dairy products, such as skim milk or low-fat yogurt. ? Avoid fatty cuts of meat, processed or cured meats, and poultry with skin. Fill about one quarter of your plate with lean proteins, such as fish, chicken without skin, beans, eggs, and tofu. ? Avoid premade and processed foods. These tend to be higher in sodium, added sugar, and fat.  Reduce your daily sodium intake. Most people with hypertension should eat less than 1,500 mg of sodium a day.  Limit alcohol intake to no more than 1 drink a day for nonpregnant women and 2 drinks a day for men. One drink equals 12 oz of beer, 5 oz of wine, or 1 oz of hard liquor. Lifestyle  Work with your health care provider to maintain a healthy body weight or to lose weight. Ask what an ideal weight is for you.  Get at least 30 minutes of exercise that causes your heart to beat faster (aerobic exercise) most days of the week. Activities may include walking, swimming, or biking.  Include exercise to strengthen your muscles (resistance exercise), such as pilates or lifting weights, as part of your weekly exercise routine. Try to do these types of exercises for 30 minutes at least 3 days a week.  Do not use any products that contain nicotine or tobacco, such as cigarettes and e-cigarettes. If you need help quitting, ask your health care provider.  Monitor your blood pressure at home as told by your health care provider.  Keep all follow-up visits as told by your health care provider. This is  important. Medicines  Take over-the-counter and prescription medicines only as told by your health care provider. Follow directions carefully. Blood pressure medicines must be taken as prescribed.  Do not skip doses of blood pressure medicine. Doing this puts you at risk for problems and can make the medicine less effective.  Ask your health care provider about side effects or reactions to medicines that you should watch for. Contact a health care provider if:  You think you are having a reaction to a medicine you are taking.  You have headaches that keep coming back (recurring).  You feel dizzy.  You have swelling in your ankles.  You have trouble with your vision. Get help right away if:  You develop a severe headache or confusion.  You have unusual weakness or numbness.  You feel faint.  You have severe pain in your chest or abdomen.  You vomit repeatedly.  You have trouble breathing. Summary  Hypertension is when the force of blood pumping through your arteries is too strong. If this condition is not controlled, it may  put you at risk for serious complications.  Your personal target blood pressure may vary depending on your medical conditions, your age, and other factors. For most people, a normal blood pressure is less than 120/80.  Hypertension is treated with lifestyle changes, medicines, or a combination of both. Lifestyle changes include weight loss, eating a healthy, low-sodium diet, exercising more, and limiting alcohol. This information is not intended to replace advice given to you by your health care provider. Make sure you discuss any questions you have with your health care provider. Document Released: 09/10/2005 Document Revised: 08/08/2016 Document Reviewed: 08/08/2016 Elsevier Interactive Patient Education  2018 [employer] Inc[employer]      Agustina Caroli, MD [facility]

## 2017-12-03 NOTE — Assessment & Plan Note (Signed)
Well-controlled on medication.  Will continue same management.

## 2017-12-03 NOTE — Assessment & Plan Note (Signed)
Taking 1 tablet of Tapazole daily.  Doing well.  Sees endocrinologist regularly.

## 2017-12-03 NOTE — Assessment & Plan Note (Signed)
Compliant with Lipitor.  Fasting today.  We will do labs.  Continue same management.

## 2017-12-03 NOTE — Patient Instructions (Addendum)
   IF you received an x-ray today, you will receive an invoice from [facility]. Please contact  Radiology at [phone] with questions or concerns regarding your invoice.   IF you received labwork today, you will receive an invoice from [facility]. Please contact [facility] at [phone] with questions or concerns regarding your invoice.   Our billing staff will not be able to assist you with questions regarding bills from these companies.  You will be contacted with the lab results as soon as they are available. The fastest way to get your results is to activate your My Chart account. Instructions are located on the last page of this paperwork. If you have not heard from us regarding the results in 2 weeks, please contact this office.      Health Maintenance, Male A healthy lifestyle and preventive care is important for your health and wellness. Ask your health care provider about what schedule of regular examinations is right for you. What should I know about weight and diet? Eat a Healthy Diet  Eat plenty of vegetables, fruits, whole grains, low-fat dairy products, and lean protein.  Do not eat a lot of foods high in solid fats, added sugars, or salt.  Maintain a Healthy Weight Regular exercise can help you achieve or maintain a healthy weight. You should:  Do at least 150 minutes of exercise each week. The exercise should increase your heart rate and make you sweat (moderate-intensity exercise).  Do strength-training exercises at least twice a week.  Watch Your Levels of Cholesterol and Blood Lipids  Have your blood tested for lipids and cholesterol every 5 years starting at 61 years of age. If you are at high risk for heart disease, you should start having your blood tested when you are 61 years old. You may need to have your cholesterol levels checked more often if: ? Your lipid or cholesterol levels are high. ? You are older than 61 years of age. ? You  are at high risk for heart disease.  What should I know about cancer screening? Many types of cancers can be detected early and may often be prevented. Lung Cancer  You should be screened every year for lung cancer if: ? You are a current smoker who has smoked for at least 30 years. ? You are a former smoker who has quit within the past 15 years.  Talk to your health care provider about your screening options, when you should start screening, and how often you should be screened.  Colorectal Cancer  Routine colorectal cancer screening usually begins at 61 years of age and should be repeated every 5-10 years until you are 61 years old. You may need to be screened more often if early forms of precancerous polyps or small growths are found. Your health care provider may recommend screening at an earlier age if you have risk factors for colon cancer.  Your health care provider may recommend using home test kits to check for hidden blood in the stool.  A small camera at the end of a tube can be used to examine your colon (sigmoidoscopy or colonoscopy). This checks for the earliest forms of colorectal cancer.  Prostate and Testicular Cancer  Depending on your age and overall health, your health care provider may do certain tests to screen for prostate and testicular cancer.  Talk to your health care provider about any symptoms or concerns you have about testicular or prostate cancer.  Skin Cancer  Check your skin   from head to toe regularly.  Tell your health care provider about any new moles or changes in moles, especially if: ? There is a change in a mole's size, shape, or color. ? You have a mole that is larger than a pencil eraser.  Always use sunscreen. Apply sunscreen liberally and repeat throughout the day.  Protect yourself by wearing long sleeves, pants, a wide-brimmed hat, and sunglasses when outside.  What should I know about heart disease, diabetes, and high blood  pressure?  If you are 18-39 years of age, have your blood pressure checked every 3-5 years. If you are 40 years of age or older, have your blood pressure checked every year. You should have your blood pressure measured twice-once when you are at a hospital or clinic, and once when you are not at a hospital or clinic. Record the average of the two measurements. To check your blood pressure when you are not at a hospital or clinic, you can use: ? An automated blood pressure machine at a pharmacy. ? A home blood pressure monitor.  Talk to your health care provider about your target blood pressure.  If you are between 45-79 years old, ask your health care provider if you should take aspirin to prevent heart disease.  Have regular diabetes screenings by checking your fasting blood sugar level. ? If you are at a normal weight and have a low risk for diabetes, have this test once every three years after the age of 45. ? If you are overweight and have a high risk for diabetes, consider being tested at a younger age or more often.  A one-time screening for abdominal aortic aneurysm (AAA) by ultrasound is recommended for men aged 65-75 years who are current or former smokers. What should I know about preventing infection? Hepatitis B If you have a higher risk for hepatitis B, you should be screened for this virus. Talk with your health care provider to find out if you are at risk for hepatitis B infection. Hepatitis C Blood testing is recommended for:  Everyone born from 1945 through 1965.  Anyone with known risk factors for hepatitis C.  Sexually Transmitted Diseases (STDs)  You should be screened each year for STDs including gonorrhea and chlamydia if: ? You are sexually active and are younger than 61 years of age. ? You are older than 61 years of age and your health care provider tells you that you are at risk for this type of infection. ? Your sexual activity has changed since you were last  screened and you are at an increased risk for chlamydia or gonorrhea. Ask your health care provider if you are at risk.  Talk with your health care provider about whether you are at high risk of being infected with HIV. Your health care provider may recommend a prescription medicine to help prevent HIV infection.  What else can I do?  Schedule regular health, dental, and eye exams.  Stay current with your vaccines (immunizations).  Do not use any tobacco products, such as cigarettes, chewing tobacco, and e-cigarettes. If you need help quitting, ask your health care provider.  Limit alcohol intake to no more than 2 drinks per day. One drink equals 12 ounces of beer, 5 ounces of wine, or 1 ounces of hard liquor.  Do not use street drugs.  Do not share needles.  Ask your health care provider for help if you need support or information about quitting drugs.  Tell your health care   if you often feel depressed.  Tell your health care provider if you have ever been abused or do not feel safe at home. This information is not intended to replace advice given to you by your health care provider. Make sure you discuss any questions you have with your health care provider. Document Released: 03/08/2008 Document Revised: 05/09/2016 Document Reviewed: 06/14/2015 Elsevier Interactive Patient Education  2018 [employer]  Hypertension Hypertension, commonly called high blood pressure, is when the force of blood pumping through the arteries is too strong. The arteries are the blood vessels that carry blood from the heart throughout the body. Hypertension forces the heart to work harder to pump blood and may cause arteries to become narrow or stiff. Having untreated or uncontrolled hypertension can cause heart attacks, strokes, kidney disease, and other problems. A blood pressure reading consists of a higher number over a lower number. Ideally, your blood pressure should be below 120/80. The  first ("top") number is called the systolic pressure. It is a measure of the pressure in your arteries as your heart beats. The second ("bottom") number is called the diastolic pressure. It is a measure of the pressure in your arteries as the heart relaxes. What are the causes? The cause of this condition is not known. What increases the risk? Some risk factors for high blood pressure are under your control. Others are not. Factors you can change  Smoking.  Having type 2 diabetes mellitus, high cholesterol, or both.  Not getting enough exercise or physical activity.  Being overweight.  Having too much fat, sugar, calories, or salt (sodium) in your diet.  Drinking too much alcohol. Factors that are difficult or impossible to change  Having chronic kidney disease.  Having a family history of high blood pressure.  Age. Risk increases with age.  Race. You may be at higher risk if you are African-American.  Gender. Men are at higher risk than women before age 45. After age 65, women are at higher risk than men.  Having obstructive sleep apnea.  Stress. What are the signs or symptoms? Extremely high blood pressure (hypertensive crisis) may cause:  Headache.  Anxiety.  Shortness of breath.  Nosebleed.  Nausea and vomiting.  Severe chest pain.  Jerky movements you cannot control (seizures).  How is this diagnosed? This condition is diagnosed by measuring your blood pressure while you are seated, with your arm resting on a surface. The cuff of the blood pressure monitor will be placed directly against the skin of your upper arm at the level of your heart. It should be measured at least twice using the same arm. Certain conditions can cause a difference in blood pressure between your right and left arms. Certain factors can cause blood pressure readings to be lower or higher than normal (elevated) for a short period of time:  When your blood pressure is higher when you are  in a health care provider's office than when you are at home, this is called white coat hypertension. Most people with this condition do not need medicines.  When your blood pressure is higher at home than when you are in a health care provider's office, this is called masked hypertension. Most people with this condition may need medicines to control blood pressure.  If you have a high blood pressure reading during one visit or you have normal blood pressure with other risk factors:  You may be asked to return on a different day to have your blood pressure checked   again.  You may be asked to monitor your blood pressure at home for 1 week or longer.  If you are diagnosed with hypertension, you may have other blood or imaging tests to help your health care provider understand your overall risk for other conditions. How is this treated? This condition is treated by making healthy lifestyle changes, such as eating healthy foods, exercising more, and reducing your alcohol intake. Your health care provider may prescribe medicine if lifestyle changes are not enough to get your blood pressure under control, and if:  Your systolic blood pressure is above 130.  Your diastolic blood pressure is above 80.  Your personal target blood pressure may vary depending on your medical conditions, your age, and other factors. Follow these instructions at home: Eating and drinking  Eat a diet that is high in fiber and potassium, and low in sodium, added sugar, and fat. An example eating plan is called the DASH (Dietary Approaches to Stop Hypertension) diet. To eat this way: ? Eat plenty of fresh fruits and vegetables. Try to fill half of your plate at each meal with fruits and vegetables. ? Eat whole grains, such as whole wheat pasta, brown rice, or whole grain bread. Fill about one quarter of your plate with whole grains. ? Eat or drink low-fat dairy products, such as skim milk or low-fat yogurt. ? Avoid fatty  cuts of meat, processed or cured meats, and poultry with skin. Fill about one quarter of your plate with lean proteins, such as fish, chicken without skin, beans, eggs, and tofu. ? Avoid premade and processed foods. These tend to be higher in sodium, added sugar, and fat.  Reduce your daily sodium intake. Most people with hypertension should eat less than 1,500 mg of sodium a day.  Limit alcohol intake to no more than 1 drink a day for nonpregnant women and 2 drinks a day for men. One drink equals 12 oz of beer, 5 oz of wine, or 1 oz of hard liquor. Lifestyle  Work with your health care provider to maintain a healthy body weight or to lose weight. Ask what an ideal weight is for you.  Get at least 30 minutes of exercise that causes your heart to beat faster (aerobic exercise) most days of the week. Activities may include walking, swimming, or biking.  Include exercise to strengthen your muscles (resistance exercise), such as pilates or lifting weights, as part of your weekly exercise routine. Try to do these types of exercises for 30 minutes at least 3 days a week.  Do not use any products that contain nicotine or tobacco, such as cigarettes and e-cigarettes. If you need help quitting, ask your health care provider.  Monitor your blood pressure at home as told by your health care provider.  Keep all follow-up visits as told by your health care provider. This is important. Medicines  Take over-the-counter and prescription medicines only as told by your health care provider. Follow directions carefully. Blood pressure medicines must be taken as prescribed.  Do not skip doses of blood pressure medicine. Doing this puts you at risk for problems and can make the medicine less effective.  Ask your health care provider about side effects or reactions to medicines that you should watch for. Contact a health care provider if:  You think you are having a reaction to a medicine you are  taking.  You have headaches that keep coming back (recurring).  You feel dizzy.  You have swelling in your   your ankles.  You have trouble with your vision. Get help right away if:  You develop a severe headache or confusion.  You have unusual weakness or numbness.  You feel faint.  You have severe pain in your chest or abdomen.  You vomit repeatedly.  You have trouble breathing. Summary  Hypertension is when the force of blood pumping through your arteries is too strong. If this condition is not controlled, it may put you at risk for serious complications.  Your personal target blood pressure may vary depending on your medical conditions, your age, and other factors. For most people, a normal blood pressure is less than 120/80.  Hypertension is treated with lifestyle changes, medicines, or a combination of both. Lifestyle changes include weight loss, eating a healthy, low-sodium diet, exercising more, and limiting alcohol. This information is not intended to replace advice given to you by your health care provider. Make sure you discuss any questions you have with your health care provider. Document Released: 09/10/2005 Document Revised: 08/08/2016 Document Reviewed: 08/08/2016 Elsevier Interactive Patient Education  [employer].

## 2017-12-04 ENCOUNTER — Encounter: Payer: Self-pay | Admitting: Radiology

## 2017-12-04 ENCOUNTER — Other Ambulatory Visit: Payer: Self-pay | Admitting: Emergency Medicine

## 2017-12-04 DIAGNOSIS — N183 Chronic kidney disease, stage 3 unspecified: Secondary | ICD-10-CM

## 2017-12-04 LAB — COMPREHENSIVE METABOLIC PANEL
ALT: 22 IU/L (ref 0–44)
AST: 18 IU/L (ref 0–40)
Albumin/Globulin Ratio: 1.6 (ref 1.2–2.2)
Albumin: 4.4 g/dL (ref 3.6–4.8)
Alkaline Phosphatase: 90 IU/L (ref 39–117)
BILIRUBIN TOTAL: 0.3 mg/dL (ref 0.0–1.2)
BUN/Creatinine Ratio: 11 (ref 10–24)
BUN: 20 mg/dL (ref 8–27)
CALCIUM: 9.7 mg/dL (ref 8.6–10.2)
CHLORIDE: 106 mmol/L (ref 96–106)
CO2: 20 mmol/L (ref 20–29)
CREATININE: 1.88 mg/dL — AB (ref 0.76–1.27)
GFR, EST AFRICAN AMERICAN: 44 mL/min/{1.73_m2} — AB (ref >59)
GFR, EST NON AFRICAN AMERICAN: 38 mL/min/{1.73_m2} — AB (ref >59)
GLUCOSE: 88 mg/dL (ref 65–99)
Globulin, Total: 2.7 g/dL (ref 1.5–4.5)
Potassium: 4.6 mmol/L (ref 3.5–5.2)
Sodium: 141 mmol/L (ref 134–144)
TOTAL PROTEIN: 7.1 g/dL (ref 6.0–8.5)

## 2017-12-04 LAB — CBC WITH DIFFERENTIAL/PLATELET
BASOS ABS: 0 10*3/uL (ref 0.0–0.2)
Basos: 1 %
EOS (ABSOLUTE): 0.3 10*3/uL (ref 0.0–0.4)
Eos: 4 %
HEMOGLOBIN: 15.5 g/dL (ref 13.0–17.7)
Hematocrit: 46.1 % (ref 37.5–51.0)
IMMATURE GRANS (ABS): 0 10*3/uL (ref 0.0–0.1)
IMMATURE GRANULOCYTES: 0 %
LYMPHS: 29 %
Lymphocytes Absolute: 1.9 10*3/uL (ref 0.7–3.1)
MCH: 29.8 pg (ref 26.6–33.0)
MCHC: 33.6 g/dL (ref 31.5–35.7)
MCV: 89 fL (ref 79–97)
MONOCYTES: 7 %
Monocytes Absolute: 0.4 10*3/uL (ref 0.1–0.9)
NEUTROS PCT: 59 %
Neutrophils Absolute: 3.9 10*3/uL (ref 1.4–7.0)
Platelets: 228 10*3/uL (ref 150–379)
RBC: 5.2 x10E6/uL (ref 4.14–5.80)
RDW: 14.1 % (ref 12.3–15.4)
WBC: 6.6 10*3/uL (ref 3.4–10.8)

## 2017-12-04 LAB — LIPID PANEL
Chol/HDL Ratio: 3.1 ratio (ref 0.0–5.0)
Cholesterol, Total: 107 mg/dL (ref 100–199)
HDL: 34 mg/dL — AB (ref >39)
LDL CALC: 53 mg/dL (ref 0–99)
Triglycerides: 99 mg/dL (ref 0–149)
VLDL CHOLESTEROL CAL: 20 mg/dL (ref 5–40)

## 2017-12-04 LAB — HEMOGLOBIN A1C
Est. average glucose Bld gHb Est-mCnc: 117 mg/dL
Hgb A1c MFr Bld: 5.7 % — ABNORMAL HIGH (ref 4.8–5.6)

## 2018-01-13 ENCOUNTER — Encounter: Payer: Self-pay | Admitting: Emergency Medicine

## 2018-01-30 ENCOUNTER — Ambulatory Visit (INDEPENDENT_AMBULATORY_CARE_PROVIDER_SITE_OTHER): Payer: BLUE CROSS/BLUE SHIELD | Admitting: Endocrinology

## 2018-01-30 ENCOUNTER — Encounter: Payer: Self-pay | Admitting: Endocrinology

## 2018-01-30 VITALS — BP 158/68 | HR 62 | Wt 194.8 lb

## 2018-01-30 DIAGNOSIS — E0590 Thyrotoxicosis, unspecified without thyrotoxic crisis or storm: Secondary | ICD-10-CM

## 2018-01-30 LAB — TSH: TSH: 6.15 u[IU]/mL — AB (ref 0.35–4.50)

## 2018-01-30 LAB — T4, FREE: Free T4: 0.61 ng/dL (ref 0.60–1.60)

## 2018-01-30 MED ORDER — METHIMAZOLE 5 MG PO TABS: 5.0000 mg | ORAL_TABLET | Freq: Every day | ORAL | 5 refills | Status: DC

## 2018-01-30 NOTE — Patient Instructions (Signed)
blood tests are requested for you today.  We'll let you know about the results. If ever you have fever while taking methimazole, stop it and call us, even if the reason is obvious, because of the risk of a rare side-effect. Please come back for a follow-up appointment in 4 months.  

## 2018-01-30 NOTE — Progress Notes (Signed)
Subjective:    Patient ID: Donald Zhang, male    DOB: 01-22-1956, 62 y.o.   MRN: [id number]  HPI Pt returns for f/u of hyperthyroidism (dx'ed early 2018; he has never had dedicated thyroid imaging; he chose tapazole rx).  pt states he feels well in general.  He takes tapazole 10 mg qd, as rx'ed.   Past Medical History:  Diagnosis Date  . Essential hypertension   . Hyperlipidemia     Past Surgical History:  Procedure Laterality Date  . CYSTOSCOPY/RETROGRADE/URETEROSCOPY/STONE EXTRACTION WITH BASKET Right 02/04/2015   Procedure: CYSTOSCOPY/RETROGRADE/URETEROSCOPY/STONE EXTRACTION WITH BASKET/STENT PLACEMENT RIGHT;  Surgeon: Rana Snare, MD;  Location: [facility];  Service: Urology;  Laterality: Right;  . HOLMIUM LASER APPLICATION Right 8/41/3244   Procedure: HOLMIUM LASER APPLICATION;  Surgeon: Rana Snare, MD;  Location: [facility];  Service: Urology;  Laterality: Right;    Social History   Socioeconomic History  . Marital status: Married    Spouse name: Not on file  . Number of children: Not on file  . Years of education: Not on file  . Highest education level: Not on file  Occupational History  . Not on file  Social Needs  . Financial resource strain: Not on file  . Food insecurity:    Worry: Not on file    Inability: Not on file  . Transportation needs:    Medical: Not on file    Non-medical: Not on file  Tobacco Use  . Smoking status: Current Every Day Smoker    Packs/day: 0.30    Years: 40.00    Pack years: 12.00    Start date: 07/17/1974  . Smokeless tobacco: Never Used  Substance and Sexual Activity  . Alcohol use: Yes    Alcohol/week: 3.6 oz    Types: 6 Cans of beer per week  . Drug use: No  . Sexual activity: Yes  Lifestyle  . Physical activity:    Days per week: Not on file    Minutes per session: Not on file  . Stress: Not on file  Relationships  . Social connections:    Talks on phone: Not on file    Gets together: Not on file    Attends  religious service: Not on file    Active member of club or organization: Not on file    Attends meetings of clubs or organizations: Not on file    Relationship status: Not on file  . Intimate partner violence:    Fear of current or ex partner: Not on file    Emotionally abused: Not on file    Physically abused: Not on file    Forced sexual activity: Not on file  Other Topics Concern  . Not on file  Social History Narrative  . Not on file    Current Outpatient Medications on File Prior to Visit  Medication Sig Dispense Refill  . aspirin EC 81 MG tablet Take 1 tablet (81 mg total) by mouth daily.    Marland Kitchen atorvastatin (LIPITOR) 40 MG tablet Take 1 tablet (40 mg total) by mouth daily. 90 tablet 3  . lisinopril-hydrochlorothiazide (PRINZIDE,ZESTORETIC) 20-25 MG tablet Take 1 tablet by mouth daily. 90 tablet 3  . sildenafil (VIAGRA) 100 MG tablet Take 0.5-1 tablets (50-100 mg total) by mouth daily as needed for erectile dysfunction. 10 tablet 11   No current facility-administered medications on file prior to visit.     No Known Allergies  Family History  Problem Relation Age of Onset  .  Hypertension Mother   . Diabetes Father   . Diabetes Brother   . Thyroid disease Neg Hx     BP (!) 158/68   Pulse 62   Wt 194 lb 12.8 oz (88.4 kg)   SpO2 98%   BMI 28.35 kg/m    Review of Systems Denies fever    Objective:   Physical Exam VS: see vs page.  GEN: no distress. eyes: no periorbital swelling; moderate bilat proptosis.  NECK: thyroid is approx 3-5 times normal size (R>L). No palpable nodule.   Lab Results  Component Value Date   TSH 6.15 (H) 01/30/2018   T3TOTAL 525 (H) 02/11/2017   T4TOTAL 19.4 (HH) 02/11/2017      Assessment & Plan:  Hyperthyroidism, overcontrolled. Reduce tapazole to 5 mg qd

## 2018-03-03 ENCOUNTER — Telehealth: Payer: Self-pay | Admitting: Emergency Medicine

## 2018-03-03 NOTE — Telephone Encounter (Signed)
Pt is scheduled with [facility] on 04/02/18 at 8:00am

## 2018-03-15 DIAGNOSIS — M7022 Olecranon bursitis, left elbow: Secondary | ICD-10-CM | POA: Diagnosis not present

## 2018-04-02 DIAGNOSIS — I129 Hypertensive chronic kidney disease with stage 1 through stage 4 chronic kidney disease, or unspecified chronic kidney disease: Secondary | ICD-10-CM | POA: Diagnosis not present

## 2018-04-02 DIAGNOSIS — N183 Chronic kidney disease, stage 3 (moderate): Secondary | ICD-10-CM | POA: Diagnosis not present

## 2018-04-02 DIAGNOSIS — N201 Calculus of ureter: Secondary | ICD-10-CM | POA: Diagnosis not present

## 2018-04-04 ENCOUNTER — Other Ambulatory Visit: Payer: Self-pay | Admitting: Nephrology

## 2018-04-04 DIAGNOSIS — N183 Chronic kidney disease, stage 3 unspecified: Secondary | ICD-10-CM

## 2018-04-08 ENCOUNTER — Other Ambulatory Visit: Payer: Self-pay | Admitting: Thoracic Surgery (Cardiothoracic Vascular Surgery)

## 2018-04-21 ENCOUNTER — Ambulatory Visit
Admission: RE | Admit: 2018-04-21 | Discharge: 2018-04-21 | Disposition: A | Discharge status: Home / Self Care | Payer: BLUE CROSS/BLUE SHIELD | Source: Ambulatory Visit | Attending: Nephrology | Admitting: Nephrology

## 2018-04-21 ENCOUNTER — Other Ambulatory Visit: Payer: BLUE CROSS/BLUE SHIELD

## 2018-04-21 DIAGNOSIS — N183 Chronic kidney disease, stage 3 unspecified: Secondary | ICD-10-CM

## 2018-04-21 DIAGNOSIS — N189 Chronic kidney disease, unspecified: Secondary | ICD-10-CM | POA: Diagnosis not present

## 2018-06-02 ENCOUNTER — Ambulatory Visit: Payer: BLUE CROSS/BLUE SHIELD | Admitting: Emergency Medicine

## 2018-06-02 ENCOUNTER — Other Ambulatory Visit: Payer: Self-pay

## 2018-06-02 ENCOUNTER — Ambulatory Visit: Payer: BLUE CROSS/BLUE SHIELD | Admitting: Endocrinology

## 2018-06-02 ENCOUNTER — Encounter: Payer: Self-pay | Admitting: Emergency Medicine

## 2018-06-02 VITALS — BP 129/76 | HR 63 | Temp 98.6°F | Resp 16 | Wt 192.2 lb

## 2018-06-02 DIAGNOSIS — Z23 Encounter for immunization: Secondary | ICD-10-CM | POA: Diagnosis not present

## 2018-06-02 DIAGNOSIS — I10 Essential (primary) hypertension: Secondary | ICD-10-CM | POA: Diagnosis not present

## 2018-06-02 DIAGNOSIS — N529 Male erectile dysfunction, unspecified: Secondary | ICD-10-CM | POA: Diagnosis not present

## 2018-06-02 DIAGNOSIS — L309 Dermatitis, unspecified: Secondary | ICD-10-CM

## 2018-06-02 DIAGNOSIS — E785 Hyperlipidemia, unspecified: Secondary | ICD-10-CM

## 2018-06-02 DIAGNOSIS — L209 Atopic dermatitis, unspecified: Secondary | ICD-10-CM | POA: Insufficient documentation

## 2018-06-02 DIAGNOSIS — M7022 Olecranon bursitis, left elbow: Secondary | ICD-10-CM

## 2018-06-02 DIAGNOSIS — Z0289 Encounter for other administrative examinations: Secondary | ICD-10-CM

## 2018-06-02 MED ORDER — LISINOPRIL-HYDROCHLOROTHIAZIDE 20-25 MG PO TABS: 1.0000 | ORAL_TABLET | Freq: Every day | ORAL | 3 refills | Status: DC

## 2018-06-02 MED ORDER — ATORVASTATIN CALCIUM 40 MG PO TABS
40.0000 mg | ORAL_TABLET | Freq: Every day | ORAL | 3 refills | Status: DC
Start: 2018-06-02 — End: 2019-09-30

## 2018-06-02 MED ORDER — SILDENAFIL CITRATE 100 MG PO TABS: 50.0000 mg | ORAL_TABLET | Freq: Every day | ORAL | 11 refills | Status: DC | PRN

## 2018-06-02 MED ORDER — TRIAMCINOLONE ACETONIDE 0.1 % EX CREA: 1.0000 "application " | TOPICAL_CREAM | Freq: Two times a day (BID) | CUTANEOUS | 1 refills | Status: DC

## 2018-06-02 NOTE — Progress Notes (Signed)
Feliciana Rossetti 62 y.o.   Chief Complaint  Patient presents with  . Medication Refill    Atorvastatin, Lisinopril-hctz and Viagra  . Elbow Pain    LEFT  x 1 month with knot     HISTORY OF PRESENT ILLNESS: This is a 62 y.o. male with history of hypertension on medications.  Doing well.  Needs medication refills. Also complaining of chronic left elbow swelling and pain and chronic rash to right hand.  Works outdoors. Otherwise doing well. No additional complaints.  BP Readings from Last 3 Encounters:  06/02/18 129/76  01/30/18 (!) 158/68  12/03/17 134/74    HPI   Prior to Admission medications   Medication Sig Start Date End Date Taking? Authorizing Provider  aspirin EC 81 MG tablet Take 1 tablet (81 mg total) by mouth daily. 06/13/16  Yes Shawnee Knapp, MD  atorvastatin (LIPITOR) 40 MG tablet Take 1 tablet (40 mg total) by mouth daily. 12/03/17 12/03/18 Yes Orton Capell, Ines Bloomer, MD  lisinopril-hydrochlorothiazide (PRINZIDE,ZESTORETIC) 20-25 MG tablet Take 1 tablet by mouth daily. 12/03/17  Yes Candy Leverett, Ines Bloomer, MD  methimazole (TAPAZOLE) 5 MG tablet Take 1 tablet (5 mg total) by mouth daily. 01/30/18  Yes Renato Shin, MD  sildenafil (VIAGRA) 100 MG tablet Take 0.5-1 tablets (50-100 mg total) by mouth daily as needed for erectile dysfunction. 05/31/17  Yes SagardiaInes Bloomer, MD    No Known Allergies  Patient Active Problem List   Diagnosis Date Noted  . Hyperlipidemia 05/31/2017  . Erectile dysfunction 05/31/2017  . Hyperthyroidism 02/16/2017  . Ureteral calculus 02/04/2015  . Tobacco dependence 01/28/2012  . Essential hypertension 12/19/2011  . Dyslipidemia 12/19/2011    Past Medical History:  Diagnosis Date  . Essential hypertension   . Hyperlipidemia     Past Surgical History:  Procedure Laterality Date  . CYSTOSCOPY/RETROGRADE/URETEROSCOPY/STONE EXTRACTION WITH BASKET Right 02/04/2015   Procedure: CYSTOSCOPY/RETROGRADE/URETEROSCOPY/STONE EXTRACTION  WITH BASKET/STENT PLACEMENT RIGHT;  Surgeon: Rana Snare, MD;  Location: [facility];  Service: Urology;  Laterality: Right;  . HOLMIUM LASER APPLICATION Right 2/70/6237   Procedure: HOLMIUM LASER APPLICATION;  Surgeon: Rana Snare, MD;  Location: [facility];  Service: Urology;  Laterality: Right;    Social History   Socioeconomic History  . Marital status: Married    Spouse name: Not on file  . Number of children: Not on file  . Years of education: Not on file  . Highest education level: Not on file  Occupational History  . Not on file  Social Needs  . Financial resource strain: Not on file  . Food insecurity:    Worry: Not on file    Inability: Not on file  . Transportation needs:    Medical: Not on file    Non-medical: Not on file  Tobacco Use  . Smoking status: Current Every Day Smoker    Packs/day: 0.30    Years: 40.00    Pack years: 12.00    Start date: 07/17/1974  . Smokeless tobacco: Never Used  Substance and Sexual Activity  . Alcohol use: Yes    Alcohol/week: 6.0 standard drinks    Types: 6 Cans of beer per week  . Drug use: No  . Sexual activity: Yes  Lifestyle  . Physical activity:    Days per week: Not on file    Minutes per session: Not on file  . Stress: Not on file  Relationships  . Social connections:    Talks on phone: Not on file  Gets together: Not on file    Attends religious service: Not on file    Active member of club or organization: Not on file    Attends meetings of clubs or organizations: Not on file    Relationship status: Not on file  . Intimate partner violence:    Fear of current or ex partner: Not on file    Emotionally abused: Not on file    Physically abused: Not on file    Forced sexual activity: Not on file  Other Topics Concern  . Not on file  Social History Narrative  . Not on file    Family History  Problem Relation Age of Onset  . Hypertension Mother   . Diabetes Father   . Diabetes Brother   . Thyroid disease Neg Hx       Review of Systems  Constitutional: Negative.  Negative for chills and fever.  HENT: Negative.  Negative for sore throat.   Eyes: Negative.  Negative for blurred vision and double vision.  Respiratory: Negative.  Negative for cough and shortness of breath.   Cardiovascular: Negative.  Negative for chest pain and palpitations.  Gastrointestinal: Negative.  Negative for abdominal pain, diarrhea, nausea and vomiting.  Genitourinary: Negative.   Musculoskeletal: Negative.  Negative for back pain, myalgias and neck pain.  Skin: Positive for rash.  Neurological: Negative.  Negative for dizziness and headaches.  Endo/Heme/Allergies: Negative.   All other systems reviewed and are negative.  Vitals:   06/02/18 0807  BP: 129/76  Pulse: 63  Resp: 16  Temp: 98.6 F (37 C)  SpO2: 97%     Physical Exam  Constitutional: He is oriented to person, place, and time. He appears well-developed and well-nourished.  HENT:  Head: Normocephalic and atraumatic.  Nose: Nose normal.  Mouth/Throat: Oropharynx is clear and moist.  Eyes: Pupils are equal, round, and reactive to light. Conjunctivae and EOM are normal.  Neck: Normal range of motion. Neck supple. No JVD present.  Cardiovascular: Normal rate, regular rhythm and normal heart sounds.  Pulmonary/Chest: Effort normal and breath sounds normal.  Abdominal: Soft. Bowel sounds are normal. He exhibits no distension. There is no tenderness.  Musculoskeletal: Normal range of motion. He exhibits no edema or tenderness.  Left elbow: Full range of motion.  No erythema.  No tenderness.  Mild swelling of the bursa.  Lymphadenopathy:    He has no cervical adenopathy.  Neurological: He is alert and oriented to person, place, and time.  Skin: Rash noted.  Right hand: Positive atopic dermatitis rash.  Vitals reviewed.   Essential hypertension Well-controlled.  Continue present medications.  Follow-up in 6 months.   ASSESSMENT & PLAN: Adolpho was  seen today for medication refill and elbow pain.  Diagnoses and all orders for this visit:  Essential hypertension -     lisinopril-hydrochlorothiazide (PRINZIDE,ZESTORETIC) 20-25 MG tablet; Take 1 tablet by mouth daily.  Erectile dysfunction, unspecified erectile dysfunction type -     sildenafil (VIAGRA) 100 MG tablet; Take 0.5-1 tablets (50-100 mg total) by mouth daily as needed for erectile dysfunction.  Hyperlipidemia, unspecified hyperlipidemia type -     atorvastatin (LIPITOR) 40 MG tablet; Take 1 tablet (40 mg total) by mouth daily.  Eczema of right hand Comments: Right hand Orders: -     triamcinolone cream (KENALOG) 0.1 %; Apply 1 application topically 2 (two) times daily.  Olecranon bursitis, left elbow    Patient Instructions  Eczema Eczema is a broad term for a group  of skin conditions that cause skin to become rough and inflamed. Each type of eczema has different triggers, symptoms, and treatments. Eczema of any type is usually itchy and symptoms range from mild to severe. Eczema and its symptoms are not spread from person to person (are not contagious). It can appear on different parts of the body at different times. Your eczema may not look the same as someone else's eczema. What are the types of eczema? Atopic dermatitis This is a long-term (chronic) skin disease that keeps coming back (recurring). Usual symptoms are dry skin and small, solid pimples that may swell and leak fluid (weep). Contact dermatitis This happens when something irritates the skin and causes a rash. The irritation can come from substances that you are allergic to (allergens), such as poison ivy, chemicals, or medicines that were applied to your skin. Dyshidrotic eczema This is a form of eczema on the hands and feet. It shows up as very itchy, fluid-filled blisters. It can affect people of any age, but is more common before age 57. Hand eczema This causes very itchy areas of skin on the palms  and sides of the hands and fingers. This type of eczema is common in industrial jobs where you may be exposed to many different types of irritants. Lichen simplex chronicus This type of eczema occurs when a person constantly scratches one area of the body. Repeated scratching of the area leads to thickened skin (lichenification). Lichen simplex chronicus can occur along with other types of eczema. It is more common in adults, but may be seen in children as well. Nummular eczema This is a common type of eczema. It has no known cause. It typically causes a red, circular, crusty lesion (plaque) that may be itchy. Scratching may become a habit and can cause bleeding. Nummular eczema occurs most often in people of middle-age or older. It most often affects the hands. Seborrheic dermatitis This is a common skin disease that mainly affects the scalp. It may also affect any oily areas of the body, such as the face, sides of nose, eyebrows, ears, eyelids, and chest. It is marked by small scaling and redness of the skin (erythema). This can affect people of all ages. In infants, this condition is known as Chartered certified accountant." Stasis dermatitis This is a common skin disease that usually appears on the legs and feet. It most often occurs in people who have a condition that prevents blood from being pumped through the veins in the legs (chronic venous insufficiency). Stasis dermatitis is a chronic condition that needs long-term management. How is eczema diagnosed? Your health care provider will examine your skin and review your medical history. He or she may also give you skin patch tests. These tests involve taking patches that contain possible allergens and placing them on your back. He or she will then check in a few days to see if an allergic reaction occurred. What are the common treatments? Treatment for eczema is based on the type of eczema you have. Hydrocortisone steroid medicine can relieve itching quickly and  help reduce inflammation. This medicine may be prescribed or obtained over-the-counter, depending on the strength of the medicine that is needed. Follow these instructions at home:  Take over-the-counter and prescription medicines only as told by your health care provider.  Use creams or ointments to moisturize your skin. Do not use lotions.  Learn what triggers or irritates your symptoms. Avoid these things.  Treat symptom flare-ups quickly.  Do not itch your  skin. This can make your rash worse.  Keep all follow-up visits as told by your health care provider. This is important. Where to find more information:  The American Academy of Dermatology: http://jones-macias.info/  The National Eczema Association: www.nationaleczema.org Contact a health care provider if:  You have serious itching, even with treatment.  You regularly scratch your skin until it bleeds.  Your rash looks different than usual.  Your skin is painful, swollen, or more red than usual.  You have a fever. Summary  There are eight general types of eczema. Each type has different triggers.  Eczema of any type causes itching that may range from mild to severe.  Treatment varies based on the type of eczema you have. Hydrocortisone steroid medicine can help with itching and inflammation.  Protecting your skin is the best way to prevent eczema. Use moisturizers and lotions. Avoid triggers and irritants, and treat flare-ups quickly. This information is not intended to replace advice given to you by your health care provider. Make sure you discuss any questions you have with your health care provider. Document Released: 01/24/2017 Document Revised: 01/24/2017 Document Reviewed: 01/24/2017 Elsevier Interactive Patient Education  2018 [employer]. Hypertension Hypertension, commonly called high blood pressure, is when the force of blood pumping through the arteries is too strong. The arteries are the blood vessels that carry  blood from the heart throughout the body. Hypertension forces the heart to work harder to pump blood and may cause arteries to become narrow or stiff. Having untreated or uncontrolled hypertension can cause heart attacks, strokes, kidney disease, and other problems. A blood pressure reading consists of a higher number over a lower number. Ideally, your blood pressure should be below 120/80. The first ("top") number is called the systolic pressure. It is a measure of the pressure in your arteries as your heart beats. The second ("bottom") number is called the diastolic pressure. It is a measure of the pressure in your arteries as the heart relaxes. What are the causes? The cause of this condition is not known. What increases the risk? Some risk factors for high blood pressure are under your control. Others are not. Factors you can change  Smoking.  Having type 2 diabetes mellitus, high cholesterol, or both.  Not getting enough exercise or physical activity.  Being overweight.  Having too much fat, sugar, calories, or salt (sodium) in your diet.  Drinking too much alcohol. Factors that are difficult or impossible to change  Having chronic kidney disease.  Having a family history of high blood pressure.  Age. Risk increases with age.  Race. You may be at higher risk if you are African-American.  Gender. Men are at higher risk than women before age 73. After age 53, women are at higher risk than men.  Having obstructive sleep apnea.  Stress. What are the signs or symptoms? Extremely high blood pressure (hypertensive crisis) may cause:  Headache.  Anxiety.  Shortness of breath.  Nosebleed.  Nausea and vomiting.  Severe chest pain.  Jerky movements you cannot control (seizures).  How is this diagnosed? This condition is diagnosed by measuring your blood pressure while you are seated, with your arm resting on a surface. The cuff of the blood pressure monitor will be  placed directly against the skin of your upper arm at the level of your heart. It should be measured at least twice using the same arm. Certain conditions can cause a difference in blood pressure between your right and left arms. Certain  factors can cause blood pressure readings to be lower or higher than normal (elevated) for a short period of time:  When your blood pressure is higher when you are in a health care provider's office than when you are at home, this is called white coat hypertension. Most people with this condition do not need medicines.  When your blood pressure is higher at home than when you are in a health care provider's office, this is called masked hypertension. Most people with this condition may need medicines to control blood pressure.  If you have a high blood pressure reading during one visit or you have normal blood pressure with other risk factors:  You may be asked to return on a different day to have your blood pressure checked again.  You may be asked to monitor your blood pressure at home for 1 week or longer.  If you are diagnosed with hypertension, you may have other blood or imaging tests to help your health care provider understand your overall risk for other conditions. How is this treated? This condition is treated by making healthy lifestyle changes, such as eating healthy foods, exercising more, and reducing your alcohol intake. Your health care provider may prescribe medicine if lifestyle changes are not enough to get your blood pressure under control, and if:  Your systolic blood pressure is above 130.  Your diastolic blood pressure is above 80.  Your personal target blood pressure may vary depending on your medical conditions, your age, and other factors. Follow these instructions at home: Eating and drinking  Eat a diet that is high in fiber and potassium, and low in sodium, added sugar, and fat. An example eating plan is called the DASH (Dietary  Approaches to Stop Hypertension) diet. To eat this way: ? Eat plenty of fresh fruits and vegetables. Try to fill half of your plate at each meal with fruits and vegetables. ? Eat whole grains, such as whole wheat pasta, brown rice, or whole grain bread. Fill about one quarter of your plate with whole grains. ? Eat or drink low-fat dairy products, such as skim milk or low-fat yogurt. ? Avoid fatty cuts of meat, processed or cured meats, and poultry with skin. Fill about one quarter of your plate with lean proteins, such as fish, chicken without skin, beans, eggs, and tofu. ? Avoid premade and processed foods. These tend to be higher in sodium, added sugar, and fat.  Reduce your daily sodium intake. Most people with hypertension should eat less than 1,500 mg of sodium a day.  Limit alcohol intake to no more than 1 drink a day for nonpregnant women and 2 drinks a day for men. One drink equals 12 oz of beer, 5 oz of wine, or 1 oz of hard liquor. Lifestyle  Work with your health care provider to maintain a healthy body weight or to lose weight. Ask what an ideal weight is for you.  Get at least 30 minutes of exercise that causes your heart to beat faster (aerobic exercise) most days of the week. Activities may include walking, swimming, or biking.  Include exercise to strengthen your muscles (resistance exercise), such as pilates or lifting weights, as part of your weekly exercise routine. Try to do these types of exercises for 30 minutes at least 3 days a week.  Do not use any products that contain nicotine or tobacco, such as cigarettes and e-cigarettes. If you need help quitting, ask your health care provider.  Monitor your blood pressure  at home as told by your health care provider.  Keep all follow-up visits as told by your health care provider. This is important. Medicines  Take over-the-counter and prescription medicines only as told by your health care provider. Follow directions  carefully. Blood pressure medicines must be taken as prescribed.  Do not skip doses of blood pressure medicine. Doing this puts you at risk for problems and can make the medicine less effective.  Ask your health care provider about side effects or reactions to medicines that you should watch for. Contact a health care provider if:  You think you are having a reaction to a medicine you are taking.  You have headaches that keep coming back (recurring).  You feel dizzy.  You have swelling in your ankles.  You have trouble with your vision. Get help right away if:  You develop a severe headache or confusion.  You have unusual weakness or numbness.  You feel faint.  You have severe pain in your chest or abdomen.  You vomit repeatedly.  You have trouble breathing. Summary  Hypertension is when the force of blood pumping through your arteries is too strong. If this condition is not controlled, it may put you at risk for serious complications.  Your personal target blood pressure may vary depending on your medical conditions, your age, and other factors. For most people, a normal blood pressure is less than 120/80.  Hypertension is treated with lifestyle changes, medicines, or a combination of both. Lifestyle changes include weight loss, eating a healthy, low-sodium diet, exercising more, and limiting alcohol. This information is not intended to replace advice given to you by your health care provider. Make sure you discuss any questions you have with your health care provider. Document Released: 09/10/2005 Document Revised: 08/08/2016 Document Reviewed: 08/08/2016 Elsevier Interactive Patient Education  2018 [employer]      Agustina Caroli, MD [facility]

## 2018-06-02 NOTE — Patient Instructions (Signed)
Eczema Eczema is a broad term for a group of skin conditions that cause skin to become rough and inflamed. Each type of eczema has different triggers, symptoms, and treatments. Eczema of any type is usually itchy and symptoms range from mild to severe. Eczema and its symptoms are not spread from person to person (are not contagious). It can appear on different parts of the body at different times. Your eczema may not look the same as someone else's eczema. What are the types of eczema? Atopic dermatitis This is a long-term (chronic) skin disease that keeps coming back (recurring). Usual symptoms are dry skin and small, solid pimples that may swell and leak fluid (weep). Contact dermatitis This happens when something irritates the skin and causes a rash. The irritation can come from substances that you are allergic to (allergens), such as poison ivy, chemicals, or medicines that were applied to your skin. Dyshidrotic eczema This is a form of eczema on the hands and feet. It shows up as very itchy, fluid-filled blisters. It can affect people of any age, but is more common before age 40. Hand eczema This causes very itchy areas of skin on the palms and sides of the hands and fingers. This type of eczema is common in industrial jobs where you may be exposed to many different types of irritants. Lichen simplex chronicus This type of eczema occurs when a person constantly scratches one area of the body. Repeated scratching of the area leads to thickened skin (lichenification). Lichen simplex chronicus can occur along with other types of eczema. It is more common in adults, but may be seen in children as well. Nummular eczema This is a common type of eczema. It has no known cause. It typically causes a red, circular, crusty lesion (plaque) that may be itchy. Scratching may become a habit and can cause bleeding. Nummular eczema occurs most often in people of middle-age or older. It most often affects the  hands. Seborrheic dermatitis This is a common skin disease that mainly affects the scalp. It may also affect any oily areas of the body, such as the face, sides of nose, eyebrows, ears, eyelids, and chest. It is marked by small scaling and redness of the skin (erythema). This can affect people of all ages. In infants, this condition is known as "cradle cap." Stasis dermatitis This is a common skin disease that usually appears on the legs and feet. It most often occurs in people who have a condition that prevents blood from being pumped through the veins in the legs (chronic venous insufficiency). Stasis dermatitis is a chronic condition that needs long-term management. How is eczema diagnosed? Your health care provider will examine your skin and review your medical history. He or she may also give you skin patch tests. These tests involve taking patches that contain possible allergens and placing them on your back. He or she will then check in a few days to see if an allergic reaction occurred. What are the common treatments? Treatment for eczema is based on the type of eczema you have. Hydrocortisone steroid medicine can relieve itching quickly and help reduce inflammation. This medicine may be prescribed or obtained over-the-counter, depending on the strength of the medicine that is needed. Follow these instructions at home:  Take over-the-counter and prescription medicines only as told by your health care provider.  Use creams or ointments to moisturize your skin. Do not use lotions.  Learn what triggers or irritates your symptoms. Avoid these things.  Treat   symptom flare-ups quickly.  Do not itch your skin. This can make your rash worse.  Keep all follow-up visits as told by your health care provider. This is important. Where to find more information:  The American Academy of Dermatology: http://jones-macias.info/  The National Eczema Association: www.nationaleczema.org Contact a health care  provider if:  You have serious itching, even with treatment.  You regularly scratch your skin until it bleeds.  Your rash looks different than usual.  Your skin is painful, swollen, or more red than usual.  You have a fever. Summary  There are eight general types of eczema. Each type has different triggers.  Eczema of any type causes itching that may range from mild to severe.  Treatment varies based on the type of eczema you have. Hydrocortisone steroid medicine can help with itching and inflammation.  Protecting your skin is the best way to prevent eczema. Use moisturizers and lotions. Avoid triggers and irritants, and treat flare-ups quickly. This information is not intended to replace advice given to you by your health care provider. Make sure you discuss any questions you have with your health care provider. Document Released: 01/24/2017 Document Revised: 01/24/2017 Document Reviewed: 01/24/2017 Elsevier Interactive Patient Education  2018 [employer]. Hypertension Hypertension, commonly called high blood pressure, is when the force of blood pumping through the arteries is too strong. The arteries are the blood vessels that carry blood from the heart throughout the body. Hypertension forces the heart to work harder to pump blood and may cause arteries to become narrow or stiff. Having untreated or uncontrolled hypertension can cause heart attacks, strokes, kidney disease, and other problems. A blood pressure reading consists of a higher number over a lower number. Ideally, your blood pressure should be below 120/80. The first ("top") number is called the systolic pressure. It is a measure of the pressure in your arteries as your heart beats. The second ("bottom") number is called the diastolic pressure. It is a measure of the pressure in your arteries as the heart relaxes. What are the causes? The cause of this condition is not known. What increases the risk? Some risk factors for  high blood pressure are under your control. Others are not. Factors you can change  Smoking.  Having type 2 diabetes mellitus, high cholesterol, or both.  Not getting enough exercise or physical activity.  Being overweight.  Having too much fat, sugar, calories, or salt (sodium) in your diet.  Drinking too much alcohol. Factors that are difficult or impossible to change  Having chronic kidney disease.  Having a family history of high blood pressure.  Age. Risk increases with age.  Race. You may be at higher risk if you are African-American.  Gender. Men are at higher risk than women before age 19. After age 61, women are at higher risk than men.  Having obstructive sleep apnea.  Stress. What are the signs or symptoms? Extremely high blood pressure (hypertensive crisis) may cause:  Headache.  Anxiety.  Shortness of breath.  Nosebleed.  Nausea and vomiting.  Severe chest pain.  Jerky movements you cannot control (seizures).  How is this diagnosed? This condition is diagnosed by measuring your blood pressure while you are seated, with your arm resting on a surface. The cuff of the blood pressure monitor will be placed directly against the skin of your upper arm at the level of your heart. It should be measured at least twice using the same arm. Certain conditions can cause a difference in blood  pressure between your right and left arms. Certain factors can cause blood pressure readings to be lower or higher than normal (elevated) for a short period of time:  When your blood pressure is higher when you are in a health care provider's office than when you are at home, this is called white coat hypertension. Most people with this condition do not need medicines.  When your blood pressure is higher at home than when you are in a health care provider's office, this is called masked hypertension. Most people with this condition may need medicines to control blood  pressure.  If you have a high blood pressure reading during one visit or you have normal blood pressure with other risk factors:  You may be asked to return on a different day to have your blood pressure checked again.  You may be asked to monitor your blood pressure at home for 1 week or longer.  If you are diagnosed with hypertension, you may have other blood or imaging tests to help your health care provider understand your overall risk for other conditions. How is this treated? This condition is treated by making healthy lifestyle changes, such as eating healthy foods, exercising more, and reducing your alcohol intake. Your health care provider may prescribe medicine if lifestyle changes are not enough to get your blood pressure under control, and if:  Your systolic blood pressure is above 130.  Your diastolic blood pressure is above 80.  Your personal target blood pressure may vary depending on your medical conditions, your age, and other factors. Follow these instructions at home: Eating and drinking  Eat a diet that is high in fiber and potassium, and low in sodium, added sugar, and fat. An example eating plan is called the DASH (Dietary Approaches to Stop Hypertension) diet. To eat this way: ? Eat plenty of fresh fruits and vegetables. Try to fill half of your plate at each meal with fruits and vegetables. ? Eat whole grains, such as whole wheat pasta, brown rice, or whole grain bread. Fill about one quarter of your plate with whole grains. ? Eat or drink low-fat dairy products, such as skim milk or low-fat yogurt. ? Avoid fatty cuts of meat, processed or cured meats, and poultry with skin. Fill about one quarter of your plate with lean proteins, such as fish, chicken without skin, beans, eggs, and tofu. ? Avoid premade and processed foods. These tend to be higher in sodium, added sugar, and fat.  Reduce your daily sodium intake. Most people with hypertension should eat less than  1,500 mg of sodium a day.  Limit alcohol intake to no more than 1 drink a day for nonpregnant women and 2 drinks a day for men. One drink equals 12 oz of beer, 5 oz of wine, or 1 oz of hard liquor. Lifestyle  Work with your health care provider to maintain a healthy body weight or to lose weight. Ask what an ideal weight is for you.  Get at least 30 minutes of exercise that causes your heart to beat faster (aerobic exercise) most days of the week. Activities may include walking, swimming, or biking.  Include exercise to strengthen your muscles (resistance exercise), such as pilates or lifting weights, as part of your weekly exercise routine. Try to do these types of exercises for 30 minutes at least 3 days a week.  Do not use any products that contain nicotine or tobacco, such as cigarettes and e-cigarettes. If you need help quitting, ask  your health care provider.  Monitor your blood pressure at home as told by your health care provider.  Keep all follow-up visits as told by your health care provider. This is important. Medicines  Take over-the-counter and prescription medicines only as told by your health care provider. Follow directions carefully. Blood pressure medicines must be taken as prescribed.  Do not skip doses of blood pressure medicine. Doing this puts you at risk for problems and can make the medicine less effective.  Ask your health care provider about side effects or reactions to medicines that you should watch for. Contact a health care provider if:  You think you are having a reaction to a medicine you are taking.  You have headaches that keep coming back (recurring).  You feel dizzy.  You have swelling in your ankles.  You have trouble with your vision. Get help right away if:  You develop a severe headache or confusion.  You have unusual weakness or numbness.  You feel faint.  You have severe pain in your chest or abdomen.  You vomit repeatedly.  You  have trouble breathing. Summary  Hypertension is when the force of blood pumping through your arteries is too strong. If this condition is not controlled, it may put you at risk for serious complications.  Your personal target blood pressure may vary depending on your medical conditions, your age, and other factors. For most people, a normal blood pressure is less than 120/80.  Hypertension is treated with lifestyle changes, medicines, or a combination of both. Lifestyle changes include weight loss, eating a healthy, low-sodium diet, exercising more, and limiting alcohol. This information is not intended to replace advice given to you by your health care provider. Make sure you discuss any questions you have with your health care provider. Document Released: 09/10/2005 Document Revised: 08/08/2016 Document Reviewed: 08/08/2016 Elsevier Interactive Patient Education  [employer].

## 2018-06-02 NOTE — Addendum Note (Signed)
Addended by: Alfredia Ferguson A on: 06/02/2018 08:36 AM   Modules accepted: Orders

## 2018-06-02 NOTE — Assessment & Plan Note (Signed)
Well-controlled.  Continue present medications.  Follow-up in 6 months. 

## 2018-06-03 ENCOUNTER — Ambulatory Visit: Payer: BLUE CROSS/BLUE SHIELD | Admitting: Emergency Medicine

## 2018-08-25 DIAGNOSIS — N189 Chronic kidney disease, unspecified: Secondary | ICD-10-CM | POA: Diagnosis not present

## 2018-08-25 DIAGNOSIS — N183 Chronic kidney disease, stage 3 (moderate): Secondary | ICD-10-CM | POA: Diagnosis not present

## 2018-11-18 DIAGNOSIS — I129 Hypertensive chronic kidney disease with stage 1 through stage 4 chronic kidney disease, or unspecified chronic kidney disease: Secondary | ICD-10-CM | POA: Diagnosis not present

## 2018-11-18 DIAGNOSIS — E559 Vitamin D deficiency, unspecified: Secondary | ICD-10-CM | POA: Diagnosis not present

## 2018-11-18 DIAGNOSIS — D631 Anemia in chronic kidney disease: Secondary | ICD-10-CM | POA: Diagnosis not present

## 2018-11-18 DIAGNOSIS — N183 Chronic kidney disease, stage 3 (moderate): Secondary | ICD-10-CM | POA: Diagnosis not present

## 2018-12-05 ENCOUNTER — Other Ambulatory Visit: Payer: Self-pay

## 2018-12-05 ENCOUNTER — Encounter: Payer: Self-pay | Admitting: Emergency Medicine

## 2018-12-05 ENCOUNTER — Ambulatory Visit: Payer: BLUE CROSS/BLUE SHIELD | Admitting: Emergency Medicine

## 2018-12-05 VITALS — BP 146/86 | HR 46 | Temp 98.4°F | Resp 16 | Wt 189.6 lb

## 2018-12-05 DIAGNOSIS — I10 Essential (primary) hypertension: Secondary | ICD-10-CM

## 2018-12-05 DIAGNOSIS — Z1211 Encounter for screening for malignant neoplasm of colon: Secondary | ICD-10-CM | POA: Diagnosis not present

## 2018-12-05 DIAGNOSIS — Z23 Encounter for immunization: Secondary | ICD-10-CM

## 2018-12-05 DIAGNOSIS — N183 Chronic kidney disease, stage 3 unspecified: Secondary | ICD-10-CM

## 2018-12-05 DIAGNOSIS — E785 Hyperlipidemia, unspecified: Secondary | ICD-10-CM | POA: Diagnosis not present

## 2018-12-05 DIAGNOSIS — M25571 Pain in right ankle and joints of right foot: Secondary | ICD-10-CM

## 2018-12-05 DIAGNOSIS — E0590 Thyrotoxicosis, unspecified without thyrotoxic crisis or storm: Secondary | ICD-10-CM | POA: Diagnosis not present

## 2018-12-05 DIAGNOSIS — I129 Hypertensive chronic kidney disease with stage 1 through stage 4 chronic kidney disease, or unspecified chronic kidney disease: Secondary | ICD-10-CM | POA: Diagnosis not present

## 2018-12-05 MED ORDER — TRAMADOL HCL 50 MG PO TABS: 50.0000 mg | ORAL_TABLET | Freq: Three times a day (TID) | ORAL | 0 refills | Status: DC | PRN

## 2018-12-05 NOTE — Patient Instructions (Addendum)
Hypertension Hypertension, commonly called high blood pressure, is when the force of blood pumping through the arteries is too strong. The arteries are the blood vessels that carry blood from the heart throughout the body. Hypertension forces the heart to work harder to pump blood and may cause arteries to become narrow or stiff. Having untreated or uncontrolled hypertension can cause heart attacks, strokes, kidney disease, and other problems. A blood pressure reading consists of a higher number over a lower number. Ideally, your blood pressure should be below 120/80. The first ("top") number is called the systolic pressure. It is a measure of the pressure in your arteries as your heart beats. The second ("bottom") number is called the diastolic pressure. It is a measure of the pressure in your arteries as the heart relaxes. What are the causes? The cause of this condition is not known. What increases the risk? Some risk factors for high blood pressure are under your control. Others are not. Factors you can change  Smoking.  Having type 2 diabetes mellitus, high cholesterol, or both.  Not getting enough exercise or physical activity.  Being overweight.  Having too much fat, sugar, calories, or salt (sodium) in your diet.  Drinking too much alcohol. Factors that are difficult or impossible to change  Having chronic kidney disease.  Having a family history of high blood pressure.  Age. Risk increases with age.  Race. You may be at higher risk if you are African-American.  Gender. Men are at higher risk than women before age 53. After age 65, women are at higher risk than men.  Having obstructive sleep apnea.  Stress. What are the signs or symptoms? Extremely high blood pressure (hypertensive crisis) may cause:  Headache.  Anxiety.  Shortness of breath.  Nosebleed.  Nausea and vomiting.  Severe chest pain.  Jerky movements you cannot control (seizures). How is this  diagnosed? This condition is diagnosed by measuring your blood pressure while you are seated, with your arm resting on a surface. The cuff of the blood pressure monitor will be placed directly against the skin of your upper arm at the level of your heart. It should be measured at least twice using the same arm. Certain conditions can cause a difference in blood pressure between your right and left arms. Certain factors can cause blood pressure readings to be lower or higher than normal (elevated) for a short period of time:  When your blood pressure is higher when you are in a health care provider's office than when you are at home, this is called white coat hypertension. Most people with this condition do not need medicines.  When your blood pressure is higher at home than when you are in a health care provider's office, this is called masked hypertension. Most people with this condition may need medicines to control blood pressure. If you have a high blood pressure reading during one visit or you have normal blood pressure with other risk factors:  You may be asked to return on a different day to have your blood pressure checked again.  You may be asked to monitor your blood pressure at home for 1 week or longer. If you are diagnosed with hypertension, you may have other blood or imaging tests to help your health care provider understand your overall risk for other conditions. How is this treated? This condition is treated by making healthy lifestyle changes, such as eating healthy foods, exercising more, and reducing your alcohol intake. Your health care provider  may prescribe medicine if lifestyle changes are not enough to get your blood pressure under control, and if:  Your systolic blood pressure is above 130.  Your diastolic blood pressure is above 80. Your personal target blood pressure may vary depending on your medical conditions, your age, and other factors. Follow these instructions  at home: Eating and drinking   Eat a diet that is high in fiber and potassium, and low in sodium, added sugar, and fat. An example eating plan is called the DASH (Dietary Approaches to Stop Hypertension) diet. To eat this way: ? Eat plenty of fresh fruits and vegetables. Try to fill half of your plate at each meal with fruits and vegetables. ? Eat whole grains, such as whole wheat pasta, brown rice, or whole grain bread. Fill about one quarter of your plate with whole grains. ? Eat or drink low-fat dairy products, such as skim milk or low-fat yogurt. ? Avoid fatty cuts of meat, processed or cured meats, and poultry with skin. Fill about one quarter of your plate with lean proteins, such as fish, chicken without skin, beans, eggs, and tofu. Avoid premade    If you have lab work done today you will be contacted with your lab results within the next 2 weeks.  If you have not heard from [place] then please contact us. The fastest way to get your results is to register for My Chart.   IF you received an x-ray today, you will receive an invoice from [facility]. Please contact [facility] at [phone] with questions or concerns regarding your invoice.   IF you received labwork today, you will receive an invoice from [place]. Please contact [facility] at [phone] with questions or concerns regarding your invoice.   Our billing staff will not be able to assist you with questions regarding bills from these companies.  You will be contacted with the lab results as soon as they are available. The fastest way to get your results is to activate your My Chart account. Instructions are located on the last page of this paperwork. If you have not heard from [place] regarding the results in 2 weeks, please contact this office.        If you have lab work done today you will be contacted with your lab results within the next 2 weeks.  If you have not heard from [place] then please contact us. The  fastest way to get your results is to register for My Chart.   IF you received an x-ray today, you will receive an invoice from [facility]. Please contact [facility] at [phone] with questions or concerns regarding your invoice.   IF you received labwork today, you will receive an invoice from [employer]. Please contact [facility] at [phone] with questions or concerns regarding your invoice.   Our billing staff will not be able to assist you with questions regarding bills from these companies.  You will be contacted with the lab results as soon as they are available. The fastest way to get your results is to activate your My Chart account. Instructions are located on the last page of this paperwork. If you have not heard from [place] regarding the results in 2 weeks, please contact this office.    ?  and processed foods. These tend to be higher in sodium, added sugar, and fat.  Reduce your daily sodium intake. Most people with hypertension should eat less than 1,500 mg of sodium a day.  Limit alcohol intake to  no more than 1 drink a day for nonpregnant women and 2 drinks a day for men. One drink equals 12 oz of beer, 5 oz of wine, or 1 oz of hard liquor. Lifestyle   Work with your health care provider to maintain a healthy body weight or to lose weight. Ask what an ideal weight is for you.  Get at least 30 minutes of exercise that causes your heart to beat faster (aerobic exercise) most days of the week. Activities may include walking, swimming, or biking.  Include exercise to strengthen your muscles (resistance exercise), such as pilates or lifting weights, as part of your weekly exercise routine. Try to do these types of exercises for 30 minutes at least 3 days a week.  Do not use any products that contain nicotine or tobacco, such as cigarettes and e-cigarettes. If you need help quitting, ask your health care provider.  Monitor your blood pressure at home as told  by your health care provider.  Keep all follow-up visits as told by your health care provider. This is important. Medicines  Take over-the-counter and prescription medicines only as told by your health care provider. Follow directions carefully. Blood pressure medicines must be taken as prescribed.  Do not skip doses of blood pressure medicine. Doing this puts you at risk for problems and can make the medicine less effective.  Ask your health care provider about side effects or reactions to medicines that you should watch for. Contact a health care provider if:  You think you are having a reaction to a medicine you are taking.  You have headaches that keep coming back (recurring).  You feel dizzy.  You have swelling in your ankles.  You have trouble with your vision. Get help right away if:  You develop a severe headache or confusion.  You have unusual weakness or numbness.  You feel faint.  You have severe pain in your chest or abdomen.  You vomit repeatedly.  You have trouble breathing. Summary  Hypertension is when the force of blood pumping through your arteries is too strong. If this condition is not controlled, it may put you at risk for serious complications.  Your personal target blood pressure may vary depending on your medical conditions, your age, and other factors. For most people, a normal blood pressure is less than 120/80.  Hypertension is treated with lifestyle changes, medicines, or a combination of both. Lifestyle changes include weight loss, eating a healthy, low-sodium diet, exercising more, and limiting alcohol. This information is not intended to replace advice given to you by your health care provider. Make sure you discuss any questions you have with your health care provider. Document Released: 09/10/2005 Document Revised: 08/08/2016 Document Reviewed: 08/08/2016 Elsevier Interactive Patient Education  2019 [employer].

## 2018-12-05 NOTE — Assessment & Plan Note (Signed)
May be gout on top of recent injury.  NSAIDs contraindicated due to kidney disease.  Prednisone not advised due to hypertension.  Will treat pain with tramadol as needed.  Will check uric acid levels.

## 2018-12-05 NOTE — Progress Notes (Signed)
BP Readings from Last 3 Encounters:  06/02/18 129/76  01/30/18 (!) 158/68  12/03/17 134/74   Lab Results  Component Value Date   CHOL 107 12/03/2017   HDL 34 (L) 12/03/2017   LDLCALC 53 12/03/2017   TRIG 99 12/03/2017   CHOLHDL 3.1 12/03/2017   Feliciana Rossetti 63 y.o.   Chief Complaint  Patient presents with  . Hypertension  . Fall    x 1 month injury to Right ankle or maybe gout, per patient    HISTORY OF PRESENT ILLNESS: This is a 63 y.o. male with history of hypertension, chronic kidney disease, dyslipidemia, here for follow-up of hypertension.  Also injured right ankle 1 month ago complaining of intermittent pain symptoms, concerned about possible gout.  No prior history of gout.  No other medical complaints or concerns today.  Otherwise doing well.  Compliant with medications.  HPI   Prior to Admission medications   Medication Sig Start Date End Date Taking? Authorizing Provider  aspirin EC 81 MG tablet Take 1 tablet (81 mg total) by mouth daily. 06/13/16  Yes Shawnee Knapp, MD  atorvastatin (LIPITOR) 40 MG tablet Take 1 tablet (40 mg total) by mouth daily. 06/02/18 06/02/19 Yes Jamarrius Salay, Ines Bloomer, MD  lisinopril-hydrochlorothiazide (PRINZIDE,ZESTORETIC) 20-25 MG tablet Take 1 tablet by mouth daily. 06/02/18  Yes Haeleigh Streiff, Ines Bloomer, MD  methimazole (TAPAZOLE) 5 MG tablet Take 1 tablet (5 mg total) by mouth daily. 01/30/18  Yes Renato Shin, MD  triamcinolone cream (KENALOG) 0.1 % Apply 1 application topically 2 (two) times daily. 06/02/18  Yes Kyanna Mahrt, Ines Bloomer, MD  sildenafil (VIAGRA) 100 MG tablet Take 0.5-1 tablets (50-100 mg total) by mouth daily as needed for erectile dysfunction. Patient not taking: Reported on 12/05/2018 06/02/18   Horald Pollen, MD    No Known Allergies  Patient Active Problem List   Diagnosis Date Noted  . Olecranon bursitis, left elbow 06/02/2018  . Atopic dermatitis 06/02/2018  . Hyperlipidemia 05/31/2017  . Erectile dysfunction  05/31/2017  . Hyperthyroidism 02/16/2017  . Ureteral calculus 02/04/2015  . Tobacco dependence 01/28/2012  . Essential hypertension 12/19/2011  . Dyslipidemia 12/19/2011    Past Medical History:  Diagnosis Date  . Essential hypertension   . Hyperlipidemia     Past Surgical History:  Procedure Laterality Date  . CYSTOSCOPY/RETROGRADE/URETEROSCOPY/STONE EXTRACTION WITH BASKET Right 02/04/2015   Procedure: CYSTOSCOPY/RETROGRADE/URETEROSCOPY/STONE EXTRACTION WITH BASKET/STENT PLACEMENT RIGHT;  Surgeon: Rana Snare, MD;  Location: [facility];  Service: Urology;  Laterality: Right;  . HOLMIUM LASER APPLICATION Right 5/95/6387   Procedure: HOLMIUM LASER APPLICATION;  Surgeon: Rana Snare, MD;  Location: [facility];  Service: Urology;  Laterality: Right;    Social History   Socioeconomic History  . Marital status: Married    Spouse name: Not on file  . Number of children: Not on file  . Years of education: Not on file  . Highest education level: Not on file  Occupational History  . Not on file  Social Needs  . Financial resource strain: Not on file  . Food insecurity:    Worry: Not on file    Inability: Not on file  . Transportation needs:    Medical: Not on file    Non-medical: Not on file  Tobacco Use  . Smoking status: Current Every Day Smoker    Packs/day: 0.30    Years: 40.00    Pack years: 12.00    Start date: 07/17/1974  . Smokeless tobacco: Never Used  Substance and  Sexual Activity  . Alcohol use: Yes    Alcohol/week: 6.0 standard drinks    Types: 6 Cans of beer per week  . Drug use: No  . Sexual activity: Yes  Lifestyle  . Physical activity:    Days per week: Not on file    Minutes per session: Not on file  . Stress: Not on file  Relationships  . Social connections:    Talks on phone: Not on file    Gets together: Not on file    Attends religious service: Not on file    Active member of club or organization: Not on file    Attends meetings of clubs or  organizations: Not on file    Relationship status: Not on file  . Intimate partner violence:    Fear of current or ex partner: Not on file    Emotionally abused: Not on file    Physically abused: Not on file    Forced sexual activity: Not on file  Other Topics Concern  . Not on file  Social History Narrative  . Not on file    Family History  Problem Relation Age of Onset  . Hypertension Mother   . Diabetes Father   . Diabetes Brother   . Thyroid disease Neg Hx      Review of Systems  Constitutional: Negative.  Negative for chills, fever and weight loss.  HENT: Negative.   Eyes: Negative.  Negative for blurred vision and double vision.  Respiratory: Negative.  Negative for shortness of breath.   Cardiovascular: Negative.  Negative for chest pain and palpitations.  Gastrointestinal: Negative.  Negative for abdominal pain, nausea and vomiting.  Genitourinary: Negative.   Musculoskeletal: Positive for joint pain (Right ankle).  Skin: Negative.  Negative for rash.  Neurological: Negative for sensory change.  Endo/Heme/Allergies: Negative.   All other systems reviewed and are negative.     Vitals:   12/05/18 0807  BP: (!) 146/86  Pulse: (!) 46  Resp: 16  Temp: 98.4 F (36.9 C)  SpO2: 97%    Physical Exam Vitals signs reviewed.  Constitutional:      Appearance: Normal appearance.  HENT:     Head: Normocephalic and atraumatic.     Nose: Nose normal.     Mouth/Throat:     Mouth: Mucous membranes are moist.     Pharynx: Oropharynx is clear.  Eyes:     Extraocular Movements: Extraocular movements intact.     Conjunctiva/sclera: Conjunctivae normal.     Pupils: Pupils are equal, round, and reactive to light.  Neck:     Musculoskeletal: Normal range of motion and neck supple.  Cardiovascular:     Rate and Rhythm: Normal rate and regular rhythm.     Heart sounds: Normal heart sounds.  Pulmonary:     Effort: Pulmonary effort is normal.     Breath sounds: Normal  breath sounds.  Abdominal:     Palpations: Abdomen is soft.     Tenderness: There is no abdominal tenderness.  Musculoskeletal:        General: No swelling.     Comments: Right ankle: No erythema or bruising.  Mild lateral tenderness.  Full range of motion.  Morning Right foot: Within normal limits with full range of motion and NVI.  Skin:    General: Skin is warm and dry.     Capillary Refill: Capillary refill takes less than 2 seconds.  Neurological:     General: No focal deficit present.  Mental Status: He is alert and oriented to person, place, and time.  Psychiatric:        Mood and Affect: Mood normal.        Behavior: Behavior normal.      ASSESSMENT & PLAN: Hypertension with renal disease Controlled hypertension.  Continue present medications.  Follow-up in 6 months.  Dyslipidemia Taking Lipitor 40 mg a day.  Not overweight.  Hyperthyroidism Clinically euthyroid.  On Tapazole.  No complaints.  Continue present medication.  Pain in joint involving right ankle and foot May be gout on top of recent injury.  NSAIDs contraindicated due to kidney disease.  Prednisone not advised due to hypertension.  Will treat pain with tramadol as needed.  Will check uric acid levels.  Serigne was seen today for hypertension and fall.  Diagnoses and all orders for this visit:  Hypertension with renal disease  Screening for colon cancer -     Ambulatory referral to Gastroenterology  Need for influenza vaccination  CKD (chronic kidney disease) stage 3, GFR 30-59 ml/min (HCC)  Essential hypertension -     Comprehensive metabolic panel  Hyperlipidemia, unspecified hyperlipidemia type -     Lipid panel  Pain in joint involving right ankle and foot Comments: Suspected gout Orders: -     Uric Acid -     traMADol (ULTRAM) 50 MG tablet; Take 1 tablet (50 mg total) by mouth every 8 (eight) hours as needed for severe pain.  Dyslipidemia  Hyperthyroidism -     TSH     Patient Instructions   Hypertension Hypertension, commonly called high blood pressure, is when the force of blood pumping through the arteries is too strong. The arteries are the blood vessels that carry blood from the heart throughout the body. Hypertension forces the heart to work harder to pump blood and may cause arteries to become narrow or stiff. Having untreated or uncontrolled hypertension can cause heart attacks, strokes, kidney disease, and other problems. A blood pressure reading consists of a higher number over a lower number. Ideally, your blood pressure should be below 120/80. The first ("top") number is called the systolic pressure. It is a measure of the pressure in your arteries as your heart beats. The second ("bottom") number is called the diastolic pressure. It is a measure of the pressure in your arteries as the heart relaxes. What are the causes? The cause of this condition is not known. What increases the risk? Some risk factors for high blood pressure are under your control. Others are not. Factors you can change  Smoking.  Having type 2 diabetes mellitus, high cholesterol, or both.  Not getting enough exercise or physical activity.  Being overweight.  Having too much fat, sugar, calories, or salt (sodium) in your diet.  Drinking too much alcohol. Factors that are difficult or impossible to change  Having chronic kidney disease.  Having a family history of high blood pressure.  Age. Risk increases with age.  Race. You may be at higher risk if you are African-American.  Gender. Men are at higher risk than women before age 59. After age 76, women are at higher risk than men.  Having obstructive sleep apnea.  Stress. What are the signs or symptoms? Extremely high blood pressure (hypertensive crisis) may cause:  Headache.  Anxiety.  Shortness of breath.  Nosebleed.  Nausea and vomiting.  Severe chest pain.  Jerky movements you cannot control  (seizures). How is this diagnosed? This condition is diagnosed by measuring your  blood pressure while you are seated, with your arm resting on a surface. The cuff of the blood pressure monitor will be placed directly against the skin of your upper arm at the level of your heart. It should be measured at least twice using the same arm. Certain conditions can cause a difference in blood pressure between your right and left arms. Certain factors can cause blood pressure readings to be lower or higher than normal (elevated) for a short period of time:  When your blood pressure is higher when you are in a health care provider's office than when you are at home, this is called white coat hypertension. Most people with this condition do not need medicines.  When your blood pressure is higher at home than when you are in a health care provider's office, this is called masked hypertension. Most people with this condition may need medicines to control blood pressure. If you have a high blood pressure reading during one visit or you have normal blood pressure with other risk factors:  You may be asked to return on a different day to have your blood pressure checked again.  You may be asked to monitor your blood pressure at home for 1 week or longer. If you are diagnosed with hypertension, you may have other blood or imaging tests to help your health care provider understand your overall risk for other conditions. How is this treated? This condition is treated by making healthy lifestyle changes, such as eating healthy foods, exercising more, and reducing your alcohol intake. Your health care provider may prescribe medicine if lifestyle changes are not enough to get your blood pressure under control, and if:  Your systolic blood pressure is above 130.  Your diastolic blood pressure is above 80. Your personal target blood pressure may vary depending on your medical conditions, your age, and other factors.  Follow these instructions at home: Eating and drinking   Eat a diet that is high in fiber and potassium, and low in sodium, added sugar, and fat. An example eating plan is called the DASH (Dietary Approaches to Stop Hypertension) diet. To eat this way: ? Eat plenty of fresh fruits and vegetables. Try to fill half of your plate at each meal with fruits and vegetables. ? Eat whole grains, such as whole wheat pasta, brown rice, or whole grain bread. Fill about one quarter of your plate with whole grains. ? Eat or drink low-fat dairy products, such as skim milk or low-fat yogurt. ? Avoid fatty cuts of meat, processed or cured meats, and poultry with skin. Fill about one quarter of your plate with lean proteins, such as fish, chicken without skin, beans, eggs, and tofu. Avoid premade    If you have lab work done today you will be contacted with your lab results within the next 2 weeks.  If you have not heard from [place] then please contact us. The fastest way to get your results is to register for My Chart.   IF you received an x-ray today, you will receive an invoice from [facility]. Please contact [facility] at [phone] with questions or concerns regarding your invoice.   IF you received labwork today, you will receive an invoice from [facility]. Please contact [facility] at [phone] with questions or concerns regarding your invoice.   Our billing staff will not be able to assist you with questions regarding bills from these companies.  You will be contacted with the lab results as soon as they are  available. The fastest way to get your results is to activate your My Chart account. Instructions are located on the last page of this paperwork. If you have not heard from [place] regarding the results in 2 weeks, please contact this office.        If you have lab work done today you will be contacted with your lab results within the next 2 weeks.  If you have not heard from [place]  then please contact us. The fastest way to get your results is to register for My Chart.   IF you received an x-ray today, you will receive an invoice from [facility]. Please contact [facility] at [phone] with questions or concerns regarding your invoice.   IF you received labwork today, you will receive an invoice from [facility]. Please contact [facility] at [phone] with questions or concerns regarding your invoice.   Our billing staff will not be able to assist you with questions regarding bills from these companies.  You will be contacted with the lab results as soon as they are available. The fastest way to get your results is to activate your My Chart account. Instructions are located on the last page of this paperwork. If you have not heard from [place] regarding the results in 2 weeks, please contact this office.    ?  and processed foods. These tend to be higher in sodium, added sugar, and fat.  Reduce your daily sodium intake. Most people with hypertension should eat less than 1,500 mg of sodium a day.  Limit alcohol intake to no more than 1 drink a day for nonpregnant women and 2 drinks a day for men. One drink equals 12 oz of beer, 5 oz of wine, or 1 oz of hard liquor. Lifestyle   Work with your health care provider to maintain a healthy body weight or to lose weight. Ask what an ideal weight is for you.  Get at least 30 minutes of exercise that causes your heart to beat faster (aerobic exercise) most days of the week. Activities may include walking, swimming, or biking.  Include exercise to strengthen your muscles (resistance exercise), such as pilates or lifting weights, as part of your weekly exercise routine. Try to do these types of exercises for 30 minutes at least 3 days a week.  Do not use any products that contain nicotine or tobacco, such as cigarettes and e-cigarettes. If you need help quitting, ask your health care provider.  Monitor your  blood pressure at home as told by your health care provider.  Keep all follow-up visits as told by your health care provider. This is important. Medicines  Take over-the-counter and prescription medicines only as told by your health care provider. Follow directions carefully. Blood pressure medicines must be taken as prescribed.  Do not skip doses of blood pressure medicine. Doing this puts you at risk for problems and can make the medicine less effective.  Ask your health care provider about side effects or reactions to medicines that you should watch for. Contact a health care provider if:  You think you are having a reaction to a medicine you are taking.  You have headaches that keep coming back (recurring).  You feel dizzy.  You have swelling in your ankles.  You have trouble with your vision. Get help right away if:  You develop a severe headache or confusion.  You have unusual weakness or numbness.  You feel faint.  You have severe pain in your chest  or abdomen.  You vomit repeatedly.  You have trouble breathing. Summary  Hypertension is when the force of blood pumping through your arteries is too strong. If this condition is not controlled, it may put you at risk for serious complications.  Your personal target blood pressure may vary depending on your medical conditions, your age, and other factors. For most people, a normal blood pressure is less than 120/80.  Hypertension is treated with lifestyle changes, medicines, or a combination of both. Lifestyle changes include weight loss, eating a healthy, low-sodium diet, exercising more, and limiting alcohol. This information is not intended to replace advice given to you by your health care provider. Make sure you discuss any questions you have with your health care provider. Document Released: 09/10/2005 Document Revised: 08/08/2016 Document Reviewed: 08/08/2016 Elsevier Interactive Patient Education  2019 [employer]vier  Inc.       Agustina Caroli, MD [facility]

## 2018-12-05 NOTE — Assessment & Plan Note (Signed)
Controlled hypertension.  Continue present medications.  Follow-up in 6 months.

## 2018-12-05 NOTE — Assessment & Plan Note (Signed)
Taking Lipitor 40 mg a day.  Not overweight.

## 2018-12-05 NOTE — Assessment & Plan Note (Signed)
Clinically euthyroid.  On Tapazole.  No complaints.  Continue present medication.

## 2018-12-06 LAB — LIPID PANEL
Chol/HDL Ratio: 3.1 ratio (ref 0.0–5.0)
Cholesterol, Total: 102 mg/dL (ref 100–199)
HDL: 33 mg/dL — ABNORMAL LOW (ref >39)
LDL Calculated: 52 mg/dL (ref 0–99)
TRIGLYCERIDES: 84 mg/dL (ref 0–149)
VLDL Cholesterol Cal: 17 mg/dL (ref 5–40)

## 2018-12-06 LAB — COMPREHENSIVE METABOLIC PANEL
ALT: 17 IU/L (ref 0–44)
AST: 18 IU/L (ref 0–40)
Albumin/Globulin Ratio: 1.8 (ref 1.2–2.2)
Albumin: 4.4 g/dL (ref 3.8–4.8)
Alkaline Phosphatase: 77 IU/L (ref 39–117)
BUN/Creatinine Ratio: 11 (ref 10–24)
BUN: 17 mg/dL (ref 8–27)
Bilirubin Total: 0.4 mg/dL (ref 0.0–1.2)
CO2: 20 mmol/L (ref 20–29)
Calcium: 9.6 mg/dL (ref 8.6–10.2)
Chloride: 101 mmol/L (ref 96–106)
Creatinine, Ser: 1.55 mg/dL — ABNORMAL HIGH (ref 0.76–1.27)
GFR calc Af Amer: 55 mL/min/{1.73_m2} — ABNORMAL LOW (ref >59)
GFR, EST NON AFRICAN AMERICAN: 48 mL/min/{1.73_m2} — AB (ref >59)
GLOBULIN, TOTAL: 2.5 g/dL (ref 1.5–4.5)
Glucose: 99 mg/dL (ref 65–99)
Potassium: 4 mmol/L (ref 3.5–5.2)
Sodium: 137 mmol/L (ref 134–144)
Total Protein: 6.9 g/dL (ref 6.0–8.5)

## 2018-12-06 LAB — TSH: TSH: 4.07 u[IU]/mL (ref 0.450–4.500)

## 2018-12-06 LAB — URIC ACID: Uric Acid: 7.5 mg/dL (ref 3.7–8.6)

## 2018-12-13 ENCOUNTER — Other Ambulatory Visit: Payer: Self-pay | Admitting: Endocrinology

## 2018-12-13 NOTE — Telephone Encounter (Signed)
Please refill x 1 Ov is due  

## 2018-12-15 ENCOUNTER — Other Ambulatory Visit: Payer: Self-pay

## 2018-12-15 MED ORDER — METHIMAZOLE 5 MG PO TABS: 5.0000 mg | ORAL_TABLET | Freq: Every day | ORAL | 5 refills | Status: DC

## 2019-02-03 ENCOUNTER — Other Ambulatory Visit: Payer: Self-pay

## 2019-02-03 ENCOUNTER — Emergency Department (HOSPITAL_COMMUNITY)
Admission: EM | Admit: 2019-02-03 | Discharge: 2019-02-03 | Disposition: A | Discharge status: Home / Self Care | Payer: BLUE CROSS/BLUE SHIELD | Attending: Emergency Medicine | Admitting: Emergency Medicine

## 2019-02-03 ENCOUNTER — Emergency Department (HOSPITAL_COMMUNITY): Payer: BLUE CROSS/BLUE SHIELD

## 2019-02-03 ENCOUNTER — Encounter (HOSPITAL_COMMUNITY): Payer: Self-pay

## 2019-02-03 DIAGNOSIS — Y939 Activity, unspecified: Secondary | ICD-10-CM | POA: Insufficient documentation

## 2019-02-03 DIAGNOSIS — M7989 Other specified soft tissue disorders: Secondary | ICD-10-CM | POA: Diagnosis not present

## 2019-02-03 DIAGNOSIS — W231XXA Caught, crushed, jammed, or pinched between stationary objects, initial encounter: Secondary | ICD-10-CM | POA: Insufficient documentation

## 2019-02-03 DIAGNOSIS — Z79899 Other long term (current) drug therapy: Secondary | ICD-10-CM | POA: Insufficient documentation

## 2019-02-03 DIAGNOSIS — I10 Essential (primary) hypertension: Secondary | ICD-10-CM | POA: Diagnosis not present

## 2019-02-03 DIAGNOSIS — S82831A Other fracture of upper and lower end of right fibula, initial encounter for closed fracture: Secondary | ICD-10-CM

## 2019-02-03 DIAGNOSIS — F17210 Nicotine dependence, cigarettes, uncomplicated: Secondary | ICD-10-CM | POA: Diagnosis not present

## 2019-02-03 DIAGNOSIS — S89301A Unspecified physeal fracture of lower end of right fibula, initial encounter for closed fracture: Secondary | ICD-10-CM | POA: Diagnosis not present

## 2019-02-03 DIAGNOSIS — Y999 Unspecified external cause status: Secondary | ICD-10-CM | POA: Diagnosis not present

## 2019-02-03 DIAGNOSIS — S99911A Unspecified injury of right ankle, initial encounter: Secondary | ICD-10-CM | POA: Diagnosis not present

## 2019-02-03 DIAGNOSIS — Y929 Unspecified place or not applicable: Secondary | ICD-10-CM | POA: Diagnosis not present

## 2019-02-03 DIAGNOSIS — Z7982 Long term (current) use of aspirin: Secondary | ICD-10-CM | POA: Diagnosis not present

## 2019-02-03 NOTE — ED Provider Notes (Signed)
[facility] EMERGENCY DEPARTMENT Provider Note   CSN: [id number] Arrival date & time: 02/03/19  0946    History   Chief Complaint Chief Complaint  Patient presents with  . Ankle Pain    HPI Donald Zhang is a 63 y.o. male.     HPI Patient presents to the emergency room for evaluation of right ankle pain.  Patient states he injured his ankle a couple of months ago.  He tripped and fell and a jackhammer landed on his right leg.  Patient states he was seen by a doctor but did not have any x-rays.  He was told he might have gout.  Patient states a few days ago he started having pain and swelling again.  The pain is on both sides of his right ankle.  He was trying to work today and was instructed to come to the ED to get evaluated.  He denies any fevers or chills.  No calf pain.  No other complaints. Past Medical History:  Diagnosis Date  . Essential hypertension   . Hyperlipidemia     Patient Active Problem List   Diagnosis Date Noted  . Pain in joint involving right ankle and foot 12/05/2018  . Hyperlipidemia 05/31/2017  . Erectile dysfunction 05/31/2017  . Hyperthyroidism 02/16/2017  . Tobacco dependence 01/28/2012  . Hypertension with renal disease 12/19/2011  . Dyslipidemia 12/19/2011    Past Surgical History:  Procedure Laterality Date  . CYSTOSCOPY/RETROGRADE/URETEROSCOPY/STONE EXTRACTION WITH BASKET Right 02/04/2015   Procedure: CYSTOSCOPY/RETROGRADE/URETEROSCOPY/STONE EXTRACTION WITH BASKET/STENT PLACEMENT RIGHT;  Surgeon: Rana Snare, MD;  Location: [facility];  Service: Urology;  Laterality: Right;  . HOLMIUM LASER APPLICATION Right 01/24/7740   Procedure: HOLMIUM LASER APPLICATION;  Surgeon: Rana Snare, MD;  Location: [facility];  Service: Urology;  Laterality: Right;        Home Medications    Prior to Admission medications   Medication Sig Start Date End Date Taking? Authorizing Provider  aspirin EC 81 MG tablet Take 1 tablet (81 mg total) by mouth daily.  06/13/16  Yes [place] Tekisha Darcey, MD  atorvastatin (LIPITOR) 40 MG tablet Take 1 tablet (40 mg total) by mouth daily. 06/02/18 06/02/19 Yes Sagardia, Ines Bloomer, MD  lisinopril-hydrochlorothiazide (PRINZIDE,ZESTORETIC) 20-25 MG tablet Take 1 tablet by mouth daily. 06/02/18  Yes Sagardia, Ines Bloomer, MD  methimazole (TAPAZOLE) 5 MG tablet Take 1 tablet (5 mg total) by mouth daily. 12/15/18  Yes Renato Shin, MD  triamcinolone cream (KENALOG) 0.1 % Apply 1 application topically 2 (two) times daily. 06/02/18  Yes Sagardia, Ines Bloomer, MD  traMADol (ULTRAM) 50 MG tablet Take 1 tablet (50 mg total) by mouth every 8 (eight) hours as needed for severe pain. Patient not taking: Reported on 02/03/2019 12/05/18   Horald Pollen, MD    Family History Family History  Problem Relation Age of Onset  . Hypertension Mother   . Diabetes Father   . Diabetes Brother   . Thyroid disease Neg Hx     Social History Social History   Tobacco Use  . Smoking status: Current Every Day Smoker    Packs/day: 0.30    Years: 40.00    Pack years: 12.00    Start date: 07/17/1974  . Smokeless tobacco: Never Used  Substance Use Topics  . Alcohol use: Yes    Comment: occ  . Drug use: No     Allergies   Patient has no known allergies.   Review of Systems Review of Systems  All other  systems reviewed and are negative.    Physical Exam Updated Vital Signs BP (!) 182/94 (BP Location: Left Arm)   Pulse 88   Temp 98.6 F (37 C) (Oral)   Resp 14   Ht 1.626 m (5\' 4" )   Wt 88.5 kg   SpO2 99%   BMI 33.47 kg/m   Physical Exam Vitals signs and nursing note reviewed.  Constitutional:      General: He is not in acute distress.    Appearance: He is well-developed.  HENT:     Head: Normocephalic and atraumatic.     Right Ear: External ear normal.     Left Ear: External ear normal.  Eyes:     General: No scleral icterus.       Right eye: No discharge.        Left eye: No discharge.     Conjunctiva/sclera:  Conjunctivae normal.  Neck:     Musculoskeletal: Neck supple.     Trachea: No tracheal deviation.  Cardiovascular:     Rate and Rhythm: Normal rate.  Pulmonary:     Effort: Pulmonary effort is normal. No respiratory distress.     Breath sounds: No stridor.  Abdominal:     General: There is no distension.  Musculoskeletal:        General: Tenderness present. No swelling or deformity.     Right ankle: Tenderness. Lateral malleolus and medial malleolus tenderness found.     Comments: Mild edema noted in the right ankle, no erythema, no calf tenderness, no lymphangitic streaking, normal perfusion in the foot  Skin:    General: Skin is warm and dry.     Findings: No rash.  Neurological:     Mental Status: He is alert.     Cranial Nerves: Cranial nerve deficit: no gross deficits.      ED Treatments / Results  Labs (all labs ordered are listed, but only abnormal results are displayed) Labs Reviewed - No data to display  EKG None  Radiology Dg Ankle Complete Right  Result Date: 02/03/2019 CLINICAL DATA:  Recent trip and fall with ankle pain, initial encounter EXAM: RIGHT ANKLE - COMPLETE 3+ VIEW COMPARISON:  None. FINDINGS: Mild soft tissue swelling is noted about the ankle joint. There are changes in the distal fibula suggestive of undisplaced fracture with a degree of healing. Mild degenerative changes of the tibiotalar joint are seen. No other soft tissue abnormality is noted. IMPRESSION: Changes consistent with prior distal fibular fracture with mild healing. No other focal abnormality is seen. Electronically Signed   By: Inez Catalina M.D.   On: 02/03/2019 11:02    Procedures Procedures (including critical care time)  Medications Ordered in ED Medications - No data to display   Initial Impression / Assessment and Plan / ED Course  I have reviewed the triage vital signs and the nursing notes.  Pertinent labs & imaging results that were available during my care of the  patient were reviewed by me and considered in my medical decision making (see chart for details).     Patient injured his ankle a couple of months ago.  He has had some persistent pain in his right ankle.  Patient never had any x-rays until today.  X-ray shows a distal fibular fracture with some mild healing.  I suspect this has been the source of his discomfort.  Already has crutches.  We will provide him with an ASO splint.  Recommend outpatient follow-up with orthopedics.  Final Clinical  Impressions(s) / ED Diagnoses   Final diagnoses:  Closed fracture of distal end of right fibula, unspecified fracture morphology, initial encounter    ED Discharge Orders    None       Dorie Rank, MD 02/03/19 1148

## 2019-02-03 NOTE — Discharge Instructions (Addendum)
The x-ray shows that you have a healing fracture of your distal fibula.  Wear the splint for support, take over-the-counter medications as needed for pain follow-up with an orthopedic doctor for further evaluation

## 2019-02-03 NOTE — ED Triage Notes (Signed)
Pt reports he tripped and fell at work 2 months ago and a 90lb jackhammer landed on r leg.  PT says saw his pcp a few days later and was diagnosed with gout.  PT says pain started back in r ankle a few days ago.

## 2019-02-06 DIAGNOSIS — S82831A Other fracture of upper and lower end of right fibula, initial encounter for closed fracture: Secondary | ICD-10-CM | POA: Diagnosis not present

## 2019-03-09 ENCOUNTER — Encounter: Payer: Self-pay | Admitting: Internal Medicine

## 2019-03-09 DIAGNOSIS — N183 Chronic kidney disease, stage 3 (moderate): Secondary | ICD-10-CM | POA: Diagnosis not present

## 2019-03-17 ENCOUNTER — Ambulatory Visit: Payer: BC Managed Care – PPO | Admitting: *Deleted

## 2019-03-17 ENCOUNTER — Other Ambulatory Visit: Payer: Self-pay

## 2019-03-17 VITALS — Ht 66.0 in | Wt 180.0 lb

## 2019-03-17 DIAGNOSIS — Z1211 Encounter for screening for malignant neoplasm of colon: Secondary | ICD-10-CM

## 2019-03-17 MED ORDER — SUPREP BOWEL PREP KIT 17.5-3.13-1.6 GM/177ML PO SOLN: 1.0000 | Freq: Once | ORAL | 0 refills | Status: AC

## 2019-03-17 NOTE — Progress Notes (Signed)
No egg or soy allergy known to patient  No issues with past sedation with any surgeries  or procedures, no intubation problems  No diet pills per patient No home 02 use per patient  No blood thinners per patient  Pt denies issues with constipation  No A fib or A flutter  EMMI video sent to pt's e mail   Pt verified name, DOB, address and insurance during PV today. Pt mailed instruction packet to included paper to complete and mail back to [facility] with addressed and stamped envelope, Emmi video, copy of consent form to read and not return, and instructions. Suprep $50  coupon mailed in packet. PV completed over the phone. Pt encouraged to call with questions or issues   Pt is aware that care partner will wait in the car during parking lot; if they feel like they will be too hot to wait in the car; they may wait in the lobby.  We want them to wear a mask (we do not have any that we can provide them), practice social distancing, and we will check their temperatures when they get here.  I did remind patient that their care partner needs to stay in the parking lot the entire time. Pt will wear mask into building

## 2019-03-18 ENCOUNTER — Encounter: Payer: Self-pay | Admitting: Internal Medicine

## 2019-03-20 DIAGNOSIS — N183 Chronic kidney disease, stage 3 (moderate): Secondary | ICD-10-CM | POA: Diagnosis not present

## 2019-03-20 DIAGNOSIS — E559 Vitamin D deficiency, unspecified: Secondary | ICD-10-CM | POA: Diagnosis not present

## 2019-03-20 DIAGNOSIS — I129 Hypertensive chronic kidney disease with stage 1 through stage 4 chronic kidney disease, or unspecified chronic kidney disease: Secondary | ICD-10-CM | POA: Diagnosis not present

## 2019-03-20 DIAGNOSIS — D631 Anemia in chronic kidney disease: Secondary | ICD-10-CM | POA: Diagnosis not present

## 2019-03-30 ENCOUNTER — Telehealth: Payer: Self-pay | Admitting: Internal Medicine

## 2019-03-30 NOTE — Telephone Encounter (Signed)

## 2019-03-31 ENCOUNTER — Ambulatory Visit (AMBULATORY_SURGERY_CENTER): Payer: BC Managed Care – PPO | Admitting: Internal Medicine

## 2019-03-31 ENCOUNTER — Other Ambulatory Visit: Payer: Self-pay

## 2019-03-31 ENCOUNTER — Encounter: Payer: Self-pay | Admitting: Internal Medicine

## 2019-03-31 VITALS — BP 156/85 | HR 59 | Temp 98.3°F | Resp 46 | Ht 66.0 in | Wt 189.0 lb

## 2019-03-31 DIAGNOSIS — Z1211 Encounter for screening for malignant neoplasm of colon: Secondary | ICD-10-CM

## 2019-03-31 DIAGNOSIS — K635 Polyp of colon: Secondary | ICD-10-CM

## 2019-03-31 DIAGNOSIS — D123 Benign neoplasm of transverse colon: Secondary | ICD-10-CM

## 2019-03-31 MED ORDER — SODIUM CHLORIDE 0.9 % IV SOLN: 500.0000 mL | Freq: Once | INTRAVENOUS | Status: DC

## 2019-03-31 NOTE — Patient Instructions (Signed)
HANDOUTS GIVEN : POLYPS AND HEMORRHOIDS    YOU HAD AN ENDOSCOPIC PROCEDURE TODAY AT [facility]:   Refer to the procedure report that was given to you for any specific questions about what was found during the examination.  If the procedure report does not answer your questions, please call your gastroenterologist to clarify.  If you requested that your care partner not be given the details of your procedure findings, then the procedure report has been included in a sealed envelope for you to review at your convenience later.  YOU SHOULD EXPECT: Some feelings of bloating in the abdomen. Passage of more gas than usual.  Walking can help get rid of the air that was put into your GI tract during the procedure and reduce the bloating. If you had a lower endoscopy (such as a colonoscopy or flexible sigmoidoscopy) you may notice spotting of blood in your stool or on the toilet paper. If you underwent a bowel prep for your procedure, you may not have a normal bowel movement for a few days.  Please Note:  You might notice some irritation and congestion in your nose or some drainage.  This is from the oxygen used during your procedure.  There is no need for concern and it should clear up in a day or so.  SYMPTOMS TO REPORT IMMEDIATELY:   Following lower endoscopy (colonoscopy or flexible sigmoidoscopy):  Excessive amounts of blood in the stool  Significant tenderness or worsening of abdominal pains  Swelling of the abdomen that is new, acute  Fever of 100F or higher   For urgent or emergent issues, a gastroenterologist can be reached at any hour by calling [phone].   DIET:  We do recommend a small meal at first, but then you may proceed to your regular diet.  Drink plenty of fluids but you should avoid alcoholic beverages for 24 hours.  ACTIVITY:  You should plan to take it easy for the rest of today and you should NOT DRIVE or use heavy machinery until tomorrow (because of  the sedation medicines used during the test).    FOLLOW UP: Our staff will call the number listed on your records 48-72 hours following your procedure to check on you and address any questions or concerns that you may have regarding the information given to you following your procedure. If we do not reach you, we will leave a message.  We will attempt to reach you two times.  During this call, we will ask if you have developed any symptoms of COVID 19. If you develop any symptoms (ie: fever, flu-like symptoms, shortness of breath, cough etc.) before then, please call [phone].  If you test positive for Covid 19 in the 2 weeks post procedure, please call and report this information to [place].    If any biopsies were taken you will be contacted by phone or by letter within the next 1-3 weeks.  Please call us at [phone] if you have not heard about the biopsies in 3 weeks.    SIGNATURES/CONFIDENTIALITY: You and/or your care partner have signed paperwork which will be entered into your electronic medical record.  These signatures attest to the fact that that the information above on your After Visit Summary has been reviewed and is understood.  Full responsibility of the confidentiality of this discharge information lies with you and/or your care-partner.

## 2019-03-31 NOTE — Progress Notes (Signed)
A/ox3, pleased with MAC, report to RN 

## 2019-03-31 NOTE — Progress Notes (Signed)
Called to room to assist during endoscopic procedure.  Patient ID and intended procedure confirmed with present staff. Received instructions for my participation in the procedure from the performing physician.  

## 2019-03-31 NOTE — Progress Notes (Signed)
Pt's states no medical or surgical changes since previsit or office visit. 

## 2019-03-31 NOTE — Op Note (Signed)
[place] Patient Name: Donald Zhang Procedure Date: 03/31/2019 9:11 AM MRN: [id number] Endoscopist: Jerene Bears , MD Age: 63 Referring MD:  Date of Birth: 1956-09-11 Gender: Male Account #: [id number] Procedure:                Colonoscopy Indications:              Screening for colorectal malignant neoplasm, This                            is the patient's first colonoscopy Medicines:                Monitored Anesthesia Care Procedure:                Pre-Anesthesia Assessment:                           - Prior to the procedure, a History and Physical                            was performed, and patient medications and                            allergies were reviewed. The patient's tolerance of                            previous anesthesia was also reviewed. The risks                            and benefits of the procedure and the sedation                            options and risks were discussed with the patient.                            All questions were answered, and informed consent                            was obtained. Prior Anticoagulants: The patient has                            taken no previous anticoagulant or antiplatelet                            agents. ASA Grade Assessment: II - A patient with                            mild systemic disease. After reviewing the risks                            and benefits, the patient was deemed in                            satisfactory condition to undergo the procedure.  After obtaining informed consent, the colonoscope                            was passed under direct vision. Throughout the                            procedure, the patient's blood pressure, pulse, and                            oxygen saturations were monitored continuously. The                            Model CF-HQ190L [id number]) scope was introduced                            through the anus and  advanced to the cecum,                            identified by appendiceal orifice and ileocecal                            valve. The colonoscopy was performed without                            difficulty. The patient tolerated the procedure                            well. The quality of the bowel preparation was                            excellent. The ileocecal valve, appendiceal                            orifice, and rectum were photographed. Scope In: 9:22:49 AM Scope Out: 9:32:35 AM Scope Withdrawal Time: 0 hours 14 minutes 17 seconds  Total Procedure Duration: 0 hours 16 minutes 5 seconds  Findings:                 The digital rectal exam was normal.                           Two sessile polyps were found in the proximal                            transverse colon. The polyps were 5 to 6 mm in                            size. These polyps were removed with a cold snare.                            Resection and retrieval were complete.                           Three sessile polyps were found in the sigmoid  colon. The polyps were 2 to 4 mm in size. These                            polyps were removed with a cold snare. Resection                            and retrieval were complete.                           Internal hemorrhoids were found during                            retroflexion. The hemorrhoids were small.                           The exam was otherwise without abnormality. Complications:            No immediate complications. Estimated Blood Loss:     Estimated blood loss was minimal. Impression:               - Two 5 to 6 mm polyps in the proximal transverse                            colon, removed with a cold snare. Resected and                            retrieved.                           - Three 2 to 4 mm polyps in the sigmoid colon,                            removed with a cold snare. Resected and retrieved.                            - Small internal hemorrhoids.                           - The examination was otherwise normal. Recommendation:           - Patient has a contact number available for                            emergencies. The signs and symptoms of potential                            delayed complications were discussed with the                            patient. Return to normal activities tomorrow.                            Written discharge instructions were provided to the  patient.                           - Resume previous diet.                           - Continue present medications.                           - Await pathology results.                           - Repeat colonoscopy is recommended for                            surveillance. The colonoscopy date will be                            determined after pathology results from today's                            exam become available for review. Jerene Bears, MD 03/31/2019 9:41:35 AM This report has been signed electronically.

## 2019-04-02 ENCOUNTER — Telehealth: Payer: Self-pay | Admitting: *Deleted

## 2019-04-02 NOTE — Telephone Encounter (Signed)
  Follow up Call-  Call back number 03/31/2019  Post procedure Call Back phone  # [phone]  Permission to leave phone message Yes  Some recent data might be hidden     Patient questions:  Do you have a fever, pain , or abdominal swelling? No. Pain Score  0 *  Have you tolerated food without any problems? Yes.    Have you been able to return to your normal activities? Yes.    Do you have any questions about your discharge instructions: Diet   No. Medications  No. Follow up visit  No.  Do you have questions or concerns about your Care? No.  Actions: * If pain score is 4 or above: No action needed, pain <4.  1. Have you developed a fever since your procedure? NO  2.   Have you had an respiratory symptoms (SOB or cough) since your procedure? NO  3.   Have you tested positive for COVID 19 since your procedure NO  4.   Have you had any family members/close contacts diagnosed with the COVID 19 since your procedure?  NO   If yes to any of these questions please route to Joylene John, RN and Alphonsa Gin, RN.

## 2019-04-07 ENCOUNTER — Encounter: Payer: Self-pay | Admitting: Internal Medicine

## 2019-07-23 ENCOUNTER — Other Ambulatory Visit: Payer: Self-pay | Admitting: Endocrinology

## 2019-08-11 ENCOUNTER — Telehealth: Payer: Self-pay | Admitting: Endocrinology

## 2019-08-11 ENCOUNTER — Telehealth: Payer: Self-pay | Admitting: *Deleted

## 2019-08-11 ENCOUNTER — Other Ambulatory Visit: Payer: Self-pay | Admitting: Emergency Medicine

## 2019-08-11 DIAGNOSIS — I10 Essential (primary) hypertension: Secondary | ICD-10-CM

## 2019-08-11 NOTE — Telephone Encounter (Signed)
Pt has appointment scheduled

## 2019-08-11 NOTE — Telephone Encounter (Signed)
Please advise 

## 2019-08-11 NOTE — Telephone Encounter (Signed)
30 day prescription sent in  Please schedule patient an appointment

## 2019-08-11 NOTE — Telephone Encounter (Signed)
Per Dr. Ellison, unable to refill Methimazole without an appt. Routing this message to the front desk for scheduling purposes.  

## 2019-08-11 NOTE — Telephone Encounter (Signed)
Requested medication (s) are due for refill today: yes  Requested medication (s) are on the active medication list: yes  Last refill:  02/11/2019  Future visit scheduled: no  Notes to clinic:  Overdue for office visit  Review for refill   Requested Prescriptions  Pending Prescriptions Disp Refills   lisinopril-hydrochlorothiazide (ZESTORETIC) 20-25 MG tablet [Pharmacy Med Name: Lisinopril-hydroCHLOROthiazide 20-25 MG Oral Tablet] 90 tablet 0    Sig: Take 1 tablet by mouth once daily     Cardiovascular:  ACEI + Diuretic Combos Failed - 08/11/2019  9:03 AM      Failed - Na in normal range and within 180 days    Sodium  Date Value Ref Range Status  12/05/2018 137 134 - 144 mmol/L Final         Failed - K in normal range and within 180 days    Potassium  Date Value Ref Range Status  12/05/2018 4.0 3.5 - 5.2 mmol/L Final         Failed - Cr in normal range and within 180 days    Creat  Date Value Ref Range Status  06/13/2016 1.56 (H) 0.70 - 1.33 mg/dL Final    Comment:      For patients > or = 62 years of age: The upper reference limit for Creatinine is approximately 13% higher for people identified as African-American.      Creatinine, Ser  Date Value Ref Range Status  12/05/2018 1.55 (H) 0.76 - 1.27 mg/dL Final         Failed - Ca in normal range and within 180 days    Calcium  Date Value Ref Range Status  12/05/2018 9.6 8.6 - 10.2 mg/dL Final         Failed - Last BP in normal range    BP Readings from Last 1 Encounters:  03/31/19 (!) 156/85         Failed - Valid encounter within last 6 months    Recent Outpatient Visits          8 months ago Hypertension with renal disease   [facility], Ines Bloomer, MD   1 year ago Essential hypertension   [facility] [place], Ines Bloomer, MD   1 year ago Essential hypertension   [facility], [place], MD   2 years ago Hyperlipidemia, unspecified hyperlipidemia  type   [facility], Ines Bloomer, MD   2 years ago Hyperthyroidism   [facility], Laurey Arrow, MD             Passed - Patient is not pregnant

## 2019-08-11 NOTE — Telephone Encounter (Signed)
1.  Please schedule f/u appt 2.  Then please refill x 1, pending that appt.  

## 2019-08-17 ENCOUNTER — Other Ambulatory Visit: Payer: Self-pay

## 2019-08-17 DIAGNOSIS — E0590 Thyrotoxicosis, unspecified without thyrotoxic crisis or storm: Secondary | ICD-10-CM

## 2019-08-17 MED ORDER — METHIMAZOLE 5 MG PO TABS: 5.0000 mg | ORAL_TABLET | Freq: Every day | ORAL | 0 refills | Status: DC

## 2019-08-17 NOTE — Telephone Encounter (Signed)
methimazole (TAPAZOLE) 5 MG tablet 30 tablet 0 08/17/2019    Sig - Route: Take 1 tablet (5 mg total) by mouth daily. - Oral   Sent to pharmacy as: methimazole (TAPAZOLE) 5 MG tablet   E-Prescribing Status: Receipt confirmed by pharmacy (08/17/2019 3:31 PM EST)

## 2019-08-17 NOTE — Telephone Encounter (Signed)
Patient is scheduled for appointment on 08/28/19 at 9:15 a.m.

## 2019-08-26 ENCOUNTER — Other Ambulatory Visit: Payer: Self-pay

## 2019-08-28 ENCOUNTER — Other Ambulatory Visit: Payer: Self-pay

## 2019-08-28 ENCOUNTER — Telehealth: Payer: Self-pay

## 2019-08-28 ENCOUNTER — Ambulatory Visit (INDEPENDENT_AMBULATORY_CARE_PROVIDER_SITE_OTHER): Payer: BC Managed Care – PPO | Admitting: Endocrinology

## 2019-08-28 ENCOUNTER — Encounter: Payer: Self-pay | Admitting: Endocrinology

## 2019-08-28 VITALS — BP 140/80 | HR 88 | Ht 66.0 in | Wt 191.0 lb

## 2019-08-28 DIAGNOSIS — E0590 Thyrotoxicosis, unspecified without thyrotoxic crisis or storm: Secondary | ICD-10-CM | POA: Diagnosis not present

## 2019-08-28 LAB — TSH: TSH: 1.55 u[IU]/mL (ref 0.35–4.50)

## 2019-08-28 LAB — T4, FREE: Free T4: 0.95 ng/dL (ref 0.60–1.60)

## 2019-08-28 NOTE — Patient Instructions (Addendum)
Your blood pressure is high today.  Please see your primary care provider soon, to have it rechecked Blood tests are requested for you today.  We'll let you know about the results.  If ever you have fever while taking methimazole, stop it and call us, even if the reason is obvious, because of the risk of a rare side-effect.  It is best to never miss the medication.  However, if you do miss it, next best is to double up the next time.   Please come back for a follow-up appointment in 3 months.   
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
## 2019-08-28 NOTE — Telephone Encounter (Signed)
Lab results reviewed by Dr. Ellison. Letter has been mailed. For future reference, letter can be found in Epic. 

## 2019-08-28 NOTE — Telephone Encounter (Signed)
-----   Message from Renato Shin, MD sent at 08/28/2019  2:00 PM EST ----- please contact patient: Normal.  Please continue the same medication. Please come back for a follow-up appointment in 6 months, rather than 3, because of good results

## 2019-09-28 ENCOUNTER — Other Ambulatory Visit: Payer: Self-pay | Admitting: Emergency Medicine

## 2019-09-28 ENCOUNTER — Other Ambulatory Visit: Payer: Self-pay | Admitting: Endocrinology

## 2019-09-28 DIAGNOSIS — E785 Hyperlipidemia, unspecified: Secondary | ICD-10-CM

## 2019-09-28 DIAGNOSIS — E0590 Thyrotoxicosis, unspecified without thyrotoxic crisis or storm: Secondary | ICD-10-CM

## 2019-09-28 DIAGNOSIS — I10 Essential (primary) hypertension: Secondary | ICD-10-CM

## 2019-09-28 NOTE — Telephone Encounter (Signed)
Requested medication (s) are due for refill today: yes  Requested medication (s) are on the active medication list: yes  Last refill: 08/11/2019  Future visit scheduled: no  Notes to clinic:  overdue for office visit  Review for refill   Requested Prescriptions  Pending Prescriptions Disp Refills   atorvastatin (LIPITOR) 40 MG tablet [Pharmacy Med Name: Atorvastatin Calcium 40 MG Oral Tablet] 90 tablet 0    Sig: Take 1 tablet by mouth once daily      Cardiovascular:  Antilipid - Statins Failed - 09/28/2019 10:27 AM      Failed - HDL in normal range and within 360 days    HDL  Date Value Ref Range Status  12/05/2018 33 (L) >39 mg/dL Final          Passed - Total Cholesterol in normal range and within 360 days    Cholesterol, Total  Date Value Ref Range Status  12/05/2018 102 100 - 199 mg/dL Final          Passed - LDL in normal range and within 360 days    LDL Calculated  Date Value Ref Range Status  12/05/2018 52 0 - 99 mg/dL Final          Passed - Triglycerides in normal range and within 360 days    Triglycerides  Date Value Ref Range Status  12/05/2018 84 0 - 149 mg/dL Final          Passed - Patient is not pregnant      Passed - Valid encounter within last 12 months    Recent Outpatient Visits           9 months ago Hypertension with renal disease   [facility] [place], Ines Bloomer, MD   1 year ago Essential hypertension   [facility] [place], Ines Bloomer, MD   1 year ago Essential hypertension   [facility] [place], [place], MD   2 years ago Hyperlipidemia, unspecified hyperlipidemia type   [facility], [place], MD   2 years ago Hyperthyroidism   [facility], Laurey Arrow, MD                lisinopril-hydrochlorothiazide (ZESTORETIC) 20-25 MG tablet [Pharmacy Med Name: Lisinopril-hydroCHLOROthiazide 20-25 MG Oral Tablet] 30 tablet 0    Sig: Take 1 tablet by mouth once  daily      Cardiovascular:  ACEI + Diuretic Combos Failed - 09/28/2019 10:27 AM      Failed - Na in normal range and within 180 days    Sodium  Date Value Ref Range Status  12/05/2018 137 134 - 144 mmol/L Final          Failed - K in normal range and within 180 days    Potassium  Date Value Ref Range Status  12/05/2018 4.0 3.5 - 5.2 mmol/L Final          Failed - Cr in normal range and within 180 days    Creat  Date Value Ref Range Status  06/13/2016 1.56 (H) 0.70 - 1.33 mg/dL Final    Comment:      For patients > or = 63 years of age: The upper reference limit for Creatinine is approximately 13% higher for people identified as African-American.      Creatinine, Ser  Date Value Ref Range Status  12/05/2018 1.55 (H) 0.76 - 1.27 mg/dL Final   Creatinine, Urine  Date  Value Ref Range Status  05/21/2016 86 20 - 370 mg/dL Final          Failed - Ca in normal range and within 180 days    Calcium  Date Value Ref Range Status  12/05/2018 9.6 8.6 - 10.2 mg/dL Final          Failed - Last BP in normal range    BP Readings from Last 1 Encounters:  08/28/19 140/80          Failed - Valid encounter within last 6 months    Recent Outpatient Visits           9 months ago Hypertension with renal disease   [facility], Ines Bloomer, MD   1 year ago Essential hypertension   [facility] [place], Ines Bloomer, MD   1 year ago Essential hypertension   [facility] [place], [place], MD   2 years ago Hyperlipidemia, unspecified hyperlipidemia type   [facility] at [facility], Ines Bloomer, MD   2 years ago Hyperthyroidism   [facility], Laurey Arrow, MD              Passed - Patient is not pregnant

## 2019-10-01 ENCOUNTER — Ambulatory Visit (INDEPENDENT_AMBULATORY_CARE_PROVIDER_SITE_OTHER): Payer: BC Managed Care – PPO

## 2019-10-01 ENCOUNTER — Encounter: Payer: Self-pay | Admitting: Emergency Medicine

## 2019-10-01 ENCOUNTER — Other Ambulatory Visit: Payer: Self-pay

## 2019-10-01 ENCOUNTER — Ambulatory Visit: Payer: BC Managed Care – PPO | Admitting: Emergency Medicine

## 2019-10-01 VITALS — BP 178/88 | HR 58 | Temp 98.2°F | Resp 16 | Ht 69.5 in | Wt 191.0 lb

## 2019-10-01 DIAGNOSIS — M25472 Effusion, left ankle: Secondary | ICD-10-CM

## 2019-10-01 DIAGNOSIS — E785 Hyperlipidemia, unspecified: Secondary | ICD-10-CM | POA: Diagnosis not present

## 2019-10-01 DIAGNOSIS — M7989 Other specified soft tissue disorders: Secondary | ICD-10-CM | POA: Diagnosis not present

## 2019-10-01 DIAGNOSIS — I10 Essential (primary) hypertension: Secondary | ICD-10-CM

## 2019-10-01 DIAGNOSIS — M19072 Primary osteoarthritis, left ankle and foot: Secondary | ICD-10-CM

## 2019-10-01 DIAGNOSIS — M25572 Pain in left ankle and joints of left foot: Secondary | ICD-10-CM | POA: Diagnosis not present

## 2019-10-01 DIAGNOSIS — L309 Dermatitis, unspecified: Secondary | ICD-10-CM | POA: Diagnosis not present

## 2019-10-01 DIAGNOSIS — N1831 Chronic kidney disease, stage 3a: Secondary | ICD-10-CM

## 2019-10-01 MED ORDER — ATORVASTATIN CALCIUM 40 MG PO TABS: 40.0000 mg | ORAL_TABLET | Freq: Every day | ORAL | 3 refills | Status: DC

## 2019-10-01 MED ORDER — SILDENAFIL CITRATE 100 MG PO TABS: 100.0000 mg | ORAL_TABLET | ORAL | 5 refills | Status: DC | PRN

## 2019-10-01 MED ORDER — SILDENAFIL CITRATE 100 MG PO TABS: ORAL_TABLET | ORAL | 5 refills | Status: DC

## 2019-10-01 MED ORDER — TRIAMCINOLONE ACETONIDE 0.1 % EX CREA: 1.0000 "application " | TOPICAL_CREAM | Freq: Two times a day (BID) | CUTANEOUS | 1 refills | Status: DC

## 2019-10-01 MED ORDER — LISINOPRIL-HYDROCHLOROTHIAZIDE 20-25 MG PO TABS: 1.0000 | ORAL_TABLET | Freq: Every day | ORAL | 3 refills | Status: DC

## 2019-10-01 NOTE — Patient Instructions (Addendum)
If you have lab work done today you will be contacted with your lab results within the next 2 weeks.  If you have not heard from [place] then please contact us. The fastest way to get your results is to register for My Chart.   IF you received an x-ray today, you will receive an invoice from [facility]. Please contact [facility] at [phone] with questions or concerns regarding your invoice.   IF you received labwork today, you will receive an invoice from [facility]. Please contact [facility] at [phone] with questions or concerns regarding your invoice.   Our billing staff will not be able to assist you with questions regarding bills from these companies.  You will be contacted with the lab results as soon as they are available. The fastest way to get your results is to activate your My Chart account. Instructions are located on the last page of this paperwork. If you have not heard from [place] regarding the results in 2 weeks, please contact this office.     Articular Cartilage Injury  An articular cartilage injury is damage to the cartilage that lines the surface of joints (articular cartilage). Articular cartilage is also called hyaline cartilage. The cartilage is smooth, white tissue that covers the ends of bones where they meet at a joint. This cartilage allows smooth movement of the joint. It also acts as a thin cushion between the bones of the joint. Injuries to joint cartilage can cause pain and decrease range of movement. Articular cartilage damage can occur from a recent (acute) injury or from wear and tear that takes place over time. The knee joint is the most common area for this type of injury. Articular cartilage injuries are also common in the ankle, shoulder, elbow, and hip. What are the causes? This injury may be caused by:  A fall.  A hard, direct hit to the joint.  Long-term wear and tear of a joint.  Arthritis.  Other injuries, such as: ? Joint  fracture or dislocation. ? A tear of joint cartilage. What increases the risk? The following factors may make you more likely to develop this condition:  Older age.  Participating in sports.  Not being active.  Being overweight. What are the signs or symptoms? Symptoms of this condition include:  Pain and swelling in the joint.  Giving way or locking of the joint.  Decreased range of motion of the joint.  A crackling or clicking sound within the joint when it moves. How is this diagnosed? This condition may be diagnosed based on your symptoms, your history of injury, and a physical exam. You may also have tests, such as:  MRI.  A procedure in which a thin scope is used to check inside the joint (arthroscopy).  X-rays to help rule out other conditions that may be similar to an articular cartilage injury. How is this treated? Treatment depends on the severity of the injury and the joint that is involved. Treatment options include:  Resting the joint.  Icing the joint.  Medicines that reduce swelling and pain (NSAIDs).  Injecting a steroid medicine into the joint.  Wearing a splint, brace, or sling.  Physical therapy.  Surgery. This may involve any of the following: ? Drilling holes through the cartilage into the bone underneath it to improve blood flow. ? Removing torn or damaged cartilage. ? Replacing cartilage with a type of cartilage graft. ? Replacing the entire joint. Follow these instructions at home: If you  have a splint, brace, or sling:  Wear it as told by your health care provider. Remove it only as told by your health care provider.  Loosen the splint or brace if your fingers or toes tingle, become numb, or turn cold and blue.  Keep the splint, brace, or sling clean.  If the splint, brace, or sling is not waterproof: ? Do not let it get wet. ? Cover it with a watertight covering when you take a bath or shower. Managing pain, stiffness, and  swelling      If directed, put ice on the injured area: ? Put ice in a plastic bag. ? Place a towel between your skin and the bag. ? Leave the ice on for 20 minutes, 2-3 times per day.  If directed, apply heat to the affected area before you exercise. Use the heat source that your health care provider recommends, such as a moist heat pack or a heating pad. ? Place a towel between your skin and the heat source. ? Leave the heat on for 20-30 minutes. ? Remove the heat if your skin turns bright red. This is especially important if you are unable to feel pain, heat, or cold. You may have a greater risk of getting burned. Activity  Return to your normal activities as told by your health care provider. Ask your health care provider what activities are safe for you.  Try doing low-impact exercise if told by your health care provider. This may include: ? Swimming. ? Water aerobics. ? Biking. Driving  Ask your health care provider when it is safe to drive if you have a splint, brace, or sling on your joint.  Do not drive or use heavy machinery while taking prescription pain medicine. General instructions  If you have a splint, do not put pressure on any part of the splint until it is fully hardened. This may take several hours.  Do not use any products that contain nicotine or tobacco, such as cigarettes, e-cigarettes, and chewing tobacco. These can delay healing. If you need help quitting, ask your health care provider.  Take over-the-counter and prescription medicines only as told by your health care provider.  If you are overweight, work with your health care provider and a dietitian to set a weight-loss goal that is healthy and reasonable for you.  Keep all follow-up visits as told by your health care provider. This is important. How is this prevented?  Maintain physical fitness, including strength and flexibility of the muscles around your joints.  Warm up and stretch properly  before doing physical activity.  Use protective equipment that fits properly.  Wear proper footwear for the physical activity that you are doing. Contact a health care provider if:  Your symptoms do not improve in 2 weeks even though you have had treatment.  You have increased pain, swelling, or stiffness. Summary  An articular cartilage injury is damage to the cartilage that lines the surface of joints (articular cartilage).  Treatment depends on the severity of the injury and the joint that is involved.  Follow instructions as told by your health care provider for managing pain, stiffness, and swelling.  Contact a health care provider if you have increased pain, swelling, or stiffness.  Keep all follow-up visits as told by your health care provider. This is important. This information is not intended to replace advice given to you by your health care provider. Make sure you discuss any questions you have with your health care provider.  Document Revised: 02/26/2018 Document Reviewed: 02/26/2018 Elsevier Patient Education  [place].  Hypertension, Adult High blood pressure (hypertension) is when the force of blood pumping through the arteries is too strong. The arteries are the blood vessels that carry blood from the heart throughout the body. Hypertension forces the heart to work harder to pump blood and may cause arteries to become narrow or stiff. Untreated or uncontrolled hypertension can cause a heart attack, heart failure, a stroke, kidney disease, and other problems. A blood pressure reading consists of a higher number over a lower number. Ideally, your blood pressure should be below 120/80. The first ("top") number is called the systolic pressure. It is a measure of the pressure in your arteries as your heart beats. The second ("bottom") number is called the diastolic pressure. It is a measure of the pressure in your arteries as the heart relaxes. What are the  causes? The exact cause of this condition is not known. There are some conditions that result in or are related to high blood pressure. What increases the risk? Some risk factors for high blood pressure are under your control. The following factors may make you more likely to develop this condition:  Smoking.  Having type 2 diabetes mellitus, high cholesterol, or both.  Not getting enough exercise or physical activity.  Being overweight.  Having too much fat, sugar, calories, or salt (sodium) in your diet.  Drinking too much alcohol. Some risk factors for high blood pressure may be difficult or impossible to change. Some of these factors include:  Having chronic kidney disease.  Having a family history of high blood pressure.  Age. Risk increases with age.  Race. You may be at higher risk if you are African American.  Gender. Men are at higher risk than women before age 54. After age 17, women are at higher risk than men.  Having obstructive sleep apnea.  Stress. What are the signs or symptoms? High blood pressure may not cause symptoms. Very high blood pressure (hypertensive crisis) may cause:  Headache.  Anxiety.  Shortness of breath.  Nosebleed.  Nausea and vomiting.  Vision changes.  Severe chest pain.  Seizures. How is this diagnosed? This condition is diagnosed by measuring your blood pressure while you are seated, with your arm resting on a flat surface, your legs uncrossed, and your feet flat on the floor. The cuff of the blood pressure monitor will be placed directly against the skin of your upper arm at the level of your heart. It should be measured at least twice using the same arm. Certain conditions can cause a difference in blood pressure between your right and left arms. Certain factors can cause blood pressure readings to be lower or higher than normal for a short period of time:  When your blood pressure is higher when you are in a health care  provider's office than when you are at home, this is called white coat hypertension. Most people with this condition do not need medicines.  When your blood pressure is higher at home than when you are in a health care provider's office, this is called masked hypertension. Most people with this condition may need medicines to control blood pressure. If you have a high blood pressure reading during one visit or you have normal blood pressure with other risk factors, you may be asked to:  Return on a different day to have your blood pressure checked again.  Monitor your blood pressure at home for 1  week or longer. If you are diagnosed with hypertension, you may have other blood or imaging tests to help your health care provider understand your overall risk for other conditions. How is this treated? This condition is treated by making healthy lifestyle changes, such as eating healthy foods, exercising more, and reducing your alcohol intake. Your health care provider may prescribe medicine if lifestyle changes are not enough to get your blood pressure under control, and if:  Your systolic blood pressure is above 130.  Your diastolic blood pressure is above 80. Your personal target blood pressure may vary depending on your medical conditions, your age, and other factors. Follow these instructions at home: Eating and drinking   Eat a diet that is high in fiber and potassium, and low in sodium, added sugar, and fat. An example eating plan is called the DASH (Dietary Approaches to Stop Hypertension) diet. To eat this way: ? Eat plenty of fresh fruits and vegetables. Try to fill one half of your plate at each meal with fruits and vegetables. ? Eat whole grains, such as whole-wheat pasta, brown rice, or whole-grain bread. Fill about one fourth of your plate with whole grains. ? Eat or drink low-fat dairy products, such as skim milk or low-fat yogurt. ? Avoid fatty cuts of meat, processed or cured  meats, and poultry with skin. Fill about one fourth of your plate with lean proteins, such as fish, chicken without skin, beans, eggs, or tofu. ? Avoid pre-made and processed foods. These tend to be higher in sodium, added sugar, and fat.  Reduce your daily sodium intake. Most people with hypertension should eat less than 1,500 mg of sodium a day.  Do not drink alcohol if: ? Your health care provider tells you not to drink. ? You are pregnant, may be pregnant, or are planning to become pregnant.  If you drink alcohol: ? Limit how much you use to:  0-1 drink a day for women.  0-2 drinks a day for men. ? Be aware of how much alcohol is in your drink. In the [place] one drink equals one 12 oz bottle of beer (355 mL), one 5 oz glass of wine (148 mL), or one 1 oz glass of hard liquor (44 mL). Lifestyle   Work with your health care provider to maintain a healthy body weight or to lose weight. Ask what an ideal weight is for you.  Get at least 30 minutes of exercise most days of the week. Activities may include walking, swimming, or biking.  Include exercise to strengthen your muscles (resistance exercise), such as Pilates or lifting weights, as part of your weekly exercise routine. Try to do these types of exercises for 30 minutes at least 3 days a week.  Do not use any products that contain nicotine or tobacco, such as cigarettes, e-cigarettes, and chewing tobacco. If you need help quitting, ask your health care provider.  Monitor your blood pressure at home as told by your health care provider.  Keep all follow-up visits as told by your health care provider. This is important. Medicines  Take over-the-counter and prescription medicines only as told by your health care provider. Follow directions carefully. Blood pressure medicines must be taken as prescribed.  Do not skip doses of blood pressure medicine. Doing this puts you at risk for problems and can make the medicine less  effective.  Ask your health care provider about side effects or reactions to medicines that you should watch for. Contact a health  care provider if you:  Think you are having a reaction to a medicine you are taking.  Have headaches that keep coming back (recurring).  Feel dizzy.  Have swelling in your ankles.  Have trouble with your vision. Get help right away if you:  Develop a severe headache or confusion.  Have unusual weakness or numbness.  Feel faint.  Have severe pain in your chest or abdomen.  Vomit repeatedly.  Have trouble breathing. Summary  Hypertension is when the force of blood pumping through your arteries is too strong. If this condition is not controlled, it may put you at risk for serious complications.  Your personal target blood pressure may vary depending on your medical conditions, your age, and other factors. For most people, a normal blood pressure is less than 120/80.  Hypertension is treated with lifestyle changes, medicines, or a combination of both. Lifestyle changes include losing weight, eating a healthy, low-sodium diet, exercising more, and limiting alcohol. This information is not intended to replace advice given to you by your health care provider. Make sure you discuss any questions you have with your health care provider. Document Revised: 05/21/2018 Document Reviewed: 05/21/2018 Elsevier Patient Education  2020 [employer].
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
## 2019-10-09 NOTE — Telephone Encounter (Signed)
Pt has routine 6 month f/u appt scheduled

## 2019-11-04 ENCOUNTER — Other Ambulatory Visit: Payer: Self-pay | Admitting: Endocrinology

## 2019-11-04 DIAGNOSIS — E0590 Thyrotoxicosis, unspecified without thyrotoxic crisis or storm: Secondary | ICD-10-CM

## 2019-11-23 DIAGNOSIS — N183 Chronic kidney disease, stage 3 unspecified: Secondary | ICD-10-CM | POA: Diagnosis not present

## 2019-11-25 ENCOUNTER — Other Ambulatory Visit: Payer: Self-pay

## 2019-11-27 ENCOUNTER — Telehealth: Payer: Self-pay

## 2019-11-27 ENCOUNTER — Other Ambulatory Visit: Payer: Self-pay

## 2019-11-27 ENCOUNTER — Ambulatory Visit: Payer: BC Managed Care – PPO | Admitting: Endocrinology

## 2019-11-27 ENCOUNTER — Encounter: Payer: Self-pay | Admitting: Endocrinology

## 2019-11-27 VITALS — BP 158/90 | HR 52 | Ht 69.5 in | Wt 190.8 lb

## 2019-11-27 DIAGNOSIS — E0590 Thyrotoxicosis, unspecified without thyrotoxic crisis or storm: Secondary | ICD-10-CM | POA: Diagnosis not present

## 2019-11-27 LAB — TSH: TSH: 1.78 u[IU]/mL (ref 0.35–4.50)

## 2019-11-27 LAB — T4, FREE: Free T4: 0.86 ng/dL (ref 0.60–1.60)
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
## 2019-11-27 NOTE — Telephone Encounter (Signed)
LAB RESULTS  Lab results were reviewed by Dr. Ellison. A letter has been mailed to pt home address. For future reference, letter can be found in Epic. 

## 2019-11-27 NOTE — Telephone Encounter (Signed)
-----   Message from Renato Shin, MD sent at 11/27/2019 12:56 PM EST ----- please contact patient: Normal.  Please continue the same medication.  I'll see you next time.

## 2019-11-27 NOTE — Patient Instructions (Addendum)
Your blood pressure is high today.  Please see your primary care provider soon, to have it rechecked Blood tests are requested for you today.  We'll let you know about the results.  If ever you have fever while taking methimazole, stop it and call us, even if the reason is obvious, because of the risk of a rare side-effect.  It is best to never miss the medication.  However, if you do miss it, next best is to double up the next time.   Please come back for a follow-up appointment in 6 months.

## 2019-12-02 DIAGNOSIS — I129 Hypertensive chronic kidney disease with stage 1 through stage 4 chronic kidney disease, or unspecified chronic kidney disease: Secondary | ICD-10-CM | POA: Diagnosis not present

## 2019-12-02 DIAGNOSIS — N1831 Chronic kidney disease, stage 3a: Secondary | ICD-10-CM | POA: Diagnosis not present

## 2019-12-02 DIAGNOSIS — D631 Anemia in chronic kidney disease: Secondary | ICD-10-CM | POA: Diagnosis not present

## 2019-12-02 DIAGNOSIS — N201 Calculus of ureter: Secondary | ICD-10-CM | POA: Diagnosis not present

## 2019-12-17 ENCOUNTER — Other Ambulatory Visit: Payer: Self-pay | Admitting: Endocrinology

## 2019-12-17 DIAGNOSIS — E0590 Thyrotoxicosis, unspecified without thyrotoxic crisis or storm: Secondary | ICD-10-CM

## 2020-01-31 ENCOUNTER — Other Ambulatory Visit: Payer: Self-pay | Admitting: Endocrinology

## 2020-01-31 DIAGNOSIS — E0590 Thyrotoxicosis, unspecified without thyrotoxic crisis or storm: Secondary | ICD-10-CM

## 2020-03-05 ENCOUNTER — Other Ambulatory Visit: Payer: Self-pay | Admitting: Endocrinology

## 2020-03-05 DIAGNOSIS — E0590 Thyrotoxicosis, unspecified without thyrotoxic crisis or storm: Secondary | ICD-10-CM

## 2020-03-21 ENCOUNTER — Ambulatory Visit (INDEPENDENT_AMBULATORY_CARE_PROVIDER_SITE_OTHER): Payer: BC Managed Care – PPO | Admitting: Emergency Medicine

## 2020-03-21 ENCOUNTER — Encounter: Payer: Self-pay | Admitting: Emergency Medicine

## 2020-03-21 ENCOUNTER — Other Ambulatory Visit: Payer: Self-pay

## 2020-03-21 VITALS — BP 142/72 | HR 64 | Temp 98.0°F | Ht 68.0 in | Wt 193.0 lb

## 2020-03-21 DIAGNOSIS — I10 Essential (primary) hypertension: Secondary | ICD-10-CM

## 2020-03-21 DIAGNOSIS — N1831 Chronic kidney disease, stage 3a: Secondary | ICD-10-CM | POA: Diagnosis not present

## 2020-03-21 DIAGNOSIS — I491 Atrial premature depolarization: Secondary | ICD-10-CM

## 2020-03-21 DIAGNOSIS — E785 Hyperlipidemia, unspecified: Secondary | ICD-10-CM

## 2020-03-21 DIAGNOSIS — I129 Hypertensive chronic kidney disease with stage 1 through stage 4 chronic kidney disease, or unspecified chronic kidney disease: Secondary | ICD-10-CM | POA: Diagnosis not present

## 2020-03-21 LAB — CMP14+EGFR
ALT: 25 IU/L (ref 0–44)
AST: 27 IU/L (ref 0–40)
Albumin/Globulin Ratio: 2 (ref 1.2–2.2)
Albumin: 4.5 g/dL (ref 3.8–4.8)
Alkaline Phosphatase: 81 IU/L (ref 48–121)
BUN/Creatinine Ratio: 11 (ref 10–24)
BUN: 18 mg/dL (ref 8–27)
Bilirubin Total: 0.6 mg/dL (ref 0.0–1.2)
CO2: 24 mmol/L (ref 20–29)
Calcium: 9.5 mg/dL (ref 8.6–10.2)
Chloride: 103 mmol/L (ref 96–106)
Creatinine, Ser: 1.61 mg/dL — ABNORMAL HIGH (ref 0.76–1.27)
GFR calc Af Amer: 52 mL/min/{1.73_m2} — ABNORMAL LOW (ref >59)
GFR calc non Af Amer: 45 mL/min/{1.73_m2} — ABNORMAL LOW (ref >59)
Globulin, Total: 2.2 g/dL (ref 1.5–4.5)
Glucose: 82 mg/dL (ref 65–99)
Potassium: 4.1 mmol/L (ref 3.5–5.2)
Sodium: 139 mmol/L (ref 134–144)
Total Protein: 6.7 g/dL (ref 6.0–8.5)

## 2020-03-21 LAB — LIPID PANEL
Chol/HDL Ratio: 2.8 ratio (ref 0.0–5.0)
Cholesterol, Total: 111 mg/dL (ref 100–199)
HDL: 40 mg/dL (ref >39)
LDL Chol Calc (NIH): 54 mg/dL (ref 0–99)
Triglycerides: 84 mg/dL (ref 0–149)
VLDL Cholesterol Cal: 17 mg/dL (ref 5–40)

## 2020-03-21 LAB — HEMOGLOBIN A1C
Est. average glucose Bld gHb Est-mCnc: 123 mg/dL
Hgb A1c MFr Bld: 5.9 % — ABNORMAL HIGH (ref 4.8–5.6)
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
## 2020-03-21 NOTE — Patient Instructions (Addendum)
   If you have lab work done today you will be contacted with your lab results within the next 2 weeks.  If you have not heard from us then please contact us. The fastest way to get your results is to register for My Chart.   IF you received an x-ray today, you will receive an invoice from [facility]. Please contact [facility] at [phone] with questions or concerns regarding your invoice.   IF you received labwork today, you will receive an invoice from [facility]. Please contact [facility] at [phone] with questions or concerns regarding your invoice.   Our billing staff will not be able to assist you with questions regarding bills from these companies.  You will be contacted with the lab results as soon as they are available. The fastest way to get your results is to activate your My Chart account. Instructions are located on the last page of this paperwork. If you have not heard from us regarding the results in 2 weeks, please contact this office.      Hypertension, Adult High blood pressure (hypertension) is when the force of blood pumping through the arteries is too strong. The arteries are the blood vessels that carry blood from the heart throughout the body. Hypertension forces the heart to work harder to pump blood and may cause arteries to become narrow or stiff. Untreated or uncontrolled hypertension can cause a heart attack, heart failure, a stroke, kidney disease, and other problems. A blood pressure reading consists of a higher number over a lower number. Ideally, your blood pressure should be below 120/80. The first ("top") number is called the systolic pressure. It is a measure of the pressure in your arteries as your heart beats. The second ("bottom") number is called the diastolic pressure. It is a measure of the pressure in your arteries as the heart relaxes. What are the causes? The exact cause of this condition is not known. There are some conditions  that result in or are related to high blood pressure. What increases the risk? Some risk factors for high blood pressure are under your control. The following factors may make you more likely to develop this condition:  Smoking.  Having type 2 diabetes mellitus, high cholesterol, or both.  Not getting enough exercise or physical activity.  Being overweight.  Having too much fat, sugar, calories, or salt (sodium) in your diet.  Drinking too much alcohol. Some risk factors for high blood pressure may be difficult or impossible to change. Some of these factors include:  Having chronic kidney disease.  Having a family history of high blood pressure.  Age. Risk increases with age.  Race. You may be at higher risk if you are African American.  Gender. Men are at higher risk than women before age 45. After age 65, women are at higher risk than men.  Having obstructive sleep apnea.  Stress. What are the signs or symptoms? High blood pressure may not cause symptoms. Very high blood pressure (hypertensive crisis) may cause:  Headache.  Anxiety.  Shortness of breath.  Nosebleed.  Nausea and vomiting.  Vision changes.  Severe chest pain.  Seizures. How is this diagnosed? This condition is diagnosed by measuring your blood pressure while you are seated, with your arm resting on a flat surface, your legs uncrossed, and your feet flat on the floor. The cuff of the blood pressure monitor will be placed directly against the skin of your upper arm at the level of your   heart. It should be measured at least twice using the same arm. Certain conditions can cause a difference in blood pressure between your right and left arms. Certain factors can cause blood pressure readings to be lower or higher than normal for a short period of time:  When your blood pressure is higher when you are in a health care provider's office than when you are at home, this is called white coat hypertension.  Most people with this condition do not need medicines.  When your blood pressure is higher at home than when you are in a health care provider's office, this is called masked hypertension. Most people with this condition may need medicines to control blood pressure. If you have a high blood pressure reading during one visit or you have normal blood pressure with other risk factors, you may be asked to:  Return on a different day to have your blood pressure checked again.  Monitor your blood pressure at home for 1 week or longer. If you are diagnosed with hypertension, you may have other blood or imaging tests to help your health care provider understand your overall risk for other conditions. How is this treated? This condition is treated by making healthy lifestyle changes, such as eating healthy foods, exercising more, and reducing your alcohol intake. Your health care provider may prescribe medicine if lifestyle changes are not enough to get your blood pressure under control, and if:  Your systolic blood pressure is above 130.  Your diastolic blood pressure is above 80. Your personal target blood pressure may vary depending on your medical conditions, your age, and other factors. Follow these instructions at home: Eating and drinking   Eat a diet that is high in fiber and potassium, and low in sodium, added sugar, and fat. An example eating plan is called the DASH (Dietary Approaches to Stop Hypertension) diet. To eat this way: ? Eat plenty of fresh fruits and vegetables. Try to fill one half of your plate at each meal with fruits and vegetables. ? Eat whole grains, such as whole-wheat pasta, brown rice, or whole-grain bread. Fill about one fourth of your plate with whole grains. ? Eat or drink low-fat dairy products, such as skim milk or low-fat yogurt. ? Avoid fatty cuts of meat, processed or cured meats, and poultry with skin. Fill about one fourth of your plate with lean proteins, such  as fish, chicken without skin, beans, eggs, or tofu. ? Avoid pre-made and processed foods. These tend to be higher in sodium, added sugar, and fat.  Reduce your daily sodium intake. Most people with hypertension should eat less than 1,500 mg of sodium a day.  Do not drink alcohol if: ? Your health care provider tells you not to drink. ? You are pregnant, may be pregnant, or are planning to become pregnant.  If you drink alcohol: ? Limit how much you use to:  0-1 drink a day for women.  0-2 drinks a day for men. ? Be aware of how much alcohol is in your drink. In the [place] one drink equals one 12 oz bottle of beer (355 mL), one 5 oz glass of wine (148 mL), or one 1 oz glass of hard liquor (44 mL). Lifestyle   Work with your health care provider to maintain a healthy body weight or to lose weight. Ask what an ideal weight is for you.  Get at least 30 minutes of exercise most days of the week. Activities may include walking, swimming,   or biking.  Include exercise to strengthen your muscles (resistance exercise), such as Pilates or lifting weights, as part of your weekly exercise routine. Try to do these types of exercises for 30 minutes at least 3 days a week.  Do not use any products that contain nicotine or tobacco, such as cigarettes, e-cigarettes, and chewing tobacco. If you need help quitting, ask your health care provider.  Monitor your blood pressure at home as told by your health care provider.  Keep all follow-up visits as told by your health care provider. This is important. Medicines  Take over-the-counter and prescription medicines only as told by your health care provider. Follow directions carefully. Blood pressure medicines must be taken as prescribed.  Do not skip doses of blood pressure medicine. Doing this puts you at risk for problems and can make the medicine less effective.  Ask your health care provider about side effects or reactions to medicines that you  should watch for. Contact a health care provider if you:  Think you are having a reaction to a medicine you are taking.  Have headaches that keep coming back (recurring).  Feel dizzy.  Have swelling in your ankles.  Have trouble with your vision. Get help right away if you:  Develop a severe headache or confusion.  Have unusual weakness or numbness.  Feel faint.  Have severe pain in your chest or abdomen.  Vomit repeatedly.  Have trouble breathing. Summary  Hypertension is when the force of blood pumping through your arteries is too strong. If this condition is not controlled, it may put you at risk for serious complications.  Your personal target blood pressure may vary depending on your medical conditions, your age, and other factors. For most people, a normal blood pressure is less than 120/80.  Hypertension is treated with lifestyle changes, medicines, or a combination of both. Lifestyle changes include losing weight, eating a healthy, low-sodium diet, exercising more, and limiting alcohol. This information is not intended to replace advice given to you by your health care provider. Make sure you discuss any questions you have with your health care provider. Document Revised: 05/21/2018 Document Reviewed: 05/21/2018 Elsevier Patient Education  2020 [employer]  

## 2020-04-14 ENCOUNTER — Other Ambulatory Visit: Payer: Self-pay | Admitting: Endocrinology

## 2020-04-14 DIAGNOSIS — E0590 Thyrotoxicosis, unspecified without thyrotoxic crisis or storm: Secondary | ICD-10-CM

## 2020-06-02 ENCOUNTER — Other Ambulatory Visit: Payer: Self-pay

## 2020-06-02 ENCOUNTER — Ambulatory Visit: Payer: BC Managed Care – PPO | Admitting: Endocrinology

## 2020-06-02 ENCOUNTER — Encounter: Payer: Self-pay | Admitting: Endocrinology

## 2020-06-02 VITALS — BP 144/78 | HR 76 | Ht 68.0 in | Wt 195.8 lb

## 2020-06-02 DIAGNOSIS — E0590 Thyrotoxicosis, unspecified without thyrotoxic crisis or storm: Secondary | ICD-10-CM | POA: Diagnosis not present

## 2020-06-02 LAB — TSH: TSH: 2.06 u[IU]/mL (ref 0.35–4.50)

## 2020-06-02 LAB — T4, FREE: Free T4: 0.96 ng/dL (ref 0.60–1.60)
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
## 2020-06-02 NOTE — Patient Instructions (Signed)
Your blood pressure is high today.  Please see your primary care provider soon, to have it rechecked Blood tests are requested for you today.  We'll let you know about the results.  If ever you have fever while taking methimazole, stop it and call us, even if the reason is obvious, because of the risk of a rare side-effect.  It is best to never miss the medication.  However, if you do miss it, next best is to double up the next time.   Please come back for a follow-up appointment in 6 months.

## 2020-06-04 ENCOUNTER — Other Ambulatory Visit: Payer: Self-pay | Admitting: Endocrinology

## 2020-06-04 DIAGNOSIS — E0590 Thyrotoxicosis, unspecified without thyrotoxic crisis or storm: Secondary | ICD-10-CM

## 2020-06-04 MED ORDER — METHIMAZOLE 5 MG PO TABS: 5.0000 mg | ORAL_TABLET | Freq: Every day | ORAL | 2 refills | Status: DC

## 2020-06-06 ENCOUNTER — Other Ambulatory Visit: Payer: Self-pay

## 2020-06-06 DIAGNOSIS — E0590 Thyrotoxicosis, unspecified without thyrotoxic crisis or storm: Secondary | ICD-10-CM

## 2020-06-06 NOTE — Progress Notes (Signed)
error 

## 2020-09-19 ENCOUNTER — Encounter: Payer: Self-pay | Admitting: Emergency Medicine

## 2020-09-19 ENCOUNTER — Ambulatory Visit: Payer: BC Managed Care – PPO | Admitting: Emergency Medicine

## 2020-09-19 ENCOUNTER — Other Ambulatory Visit: Payer: Self-pay

## 2020-09-19 VITALS — BP 130/80 | HR 65 | Temp 98.3°F | Resp 16 | Ht 68.0 in | Wt 195.0 lb

## 2020-09-19 DIAGNOSIS — L309 Dermatitis, unspecified: Secondary | ICD-10-CM

## 2020-09-19 DIAGNOSIS — E0590 Thyrotoxicosis, unspecified without thyrotoxic crisis or storm: Secondary | ICD-10-CM

## 2020-09-19 DIAGNOSIS — I129 Hypertensive chronic kidney disease with stage 1 through stage 4 chronic kidney disease, or unspecified chronic kidney disease: Secondary | ICD-10-CM

## 2020-09-19 DIAGNOSIS — I10 Essential (primary) hypertension: Secondary | ICD-10-CM

## 2020-09-19 DIAGNOSIS — E785 Hyperlipidemia, unspecified: Secondary | ICD-10-CM

## 2020-09-19 DIAGNOSIS — N1831 Chronic kidney disease, stage 3a: Secondary | ICD-10-CM | POA: Diagnosis not present

## 2020-09-19 DIAGNOSIS — R7303 Prediabetes: Secondary | ICD-10-CM | POA: Insufficient documentation

## 2020-09-19 MED ORDER — TRIAMCINOLONE ACETONIDE 0.1 % EX CREA: 1.0000 "application " | TOPICAL_CREAM | Freq: Two times a day (BID) | CUTANEOUS | 1 refills | Status: AC

## 2020-09-19 MED ORDER — ATORVASTATIN CALCIUM 40 MG PO TABS: 40.0000 mg | ORAL_TABLET | Freq: Every day | ORAL | 3 refills | Status: DC

## 2020-09-19 MED ORDER — SILDENAFIL CITRATE 100 MG PO TABS: 100.0000 mg | ORAL_TABLET | ORAL | 5 refills | Status: AC | PRN

## 2020-09-19 MED ORDER — LISINOPRIL-HYDROCHLOROTHIAZIDE 20-25 MG PO TABS: 1.0000 | ORAL_TABLET | Freq: Every day | ORAL | 3 refills | Status: DC
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
## 2020-09-19 NOTE — Assessment & Plan Note (Signed)
Systolic blood pressure elevated in the office.  Normal blood pressure readings at home.  Continue present medication. Importance of controlling blood pressure and high cholesterol discussed with patient.  Continue cholesterol medication. Advised to stop smoking.  Not interested in quitting. Nutrition discussed with patient. Aware of chronic kidney insufficiency. Follow-up in 6 months.

## 2020-09-19 NOTE — Patient Instructions (Addendum)
   If you have lab work done today you will be contacted with your lab results within the next 2 weeks.  If you have not heard from us then please contact us. The fastest way to get your results is to register for My Chart.   IF you received an x-ray today, you will receive an invoice from [facility]. Please contact [facility] at [phone] with questions or concerns regarding your invoice.   IF you received labwork today, you will receive an invoice from [facility]. Please contact [facility] at [phone] with questions or concerns regarding your invoice.   Our billing staff will not be able to assist you with questions regarding bills from these companies.  You will be contacted with the lab results as soon as they are available. The fastest way to get your results is to activate your My Chart account. Instructions are located on the last page of this paperwork. If you have not heard from us regarding the results in 2 weeks, please contact this office.      Hypertension, Adult High blood pressure (hypertension) is when the force of blood pumping through the arteries is too strong. The arteries are the blood vessels that carry blood from the heart throughout the body. Hypertension forces the heart to work harder to pump blood and may cause arteries to become narrow or stiff. Untreated or uncontrolled hypertension can cause a heart attack, heart failure, a stroke, kidney disease, and other problems. A blood pressure reading consists of a higher number over a lower number. Ideally, your blood pressure should be below 120/80. The first ("top") number is called the systolic pressure. It is a measure of the pressure in your arteries as your heart beats. The second ("bottom") number is called the diastolic pressure. It is a measure of the pressure in your arteries as the heart relaxes. What are the causes? The exact cause of this condition is not known. There are some conditions  that result in or are related to high blood pressure. What increases the risk? Some risk factors for high blood pressure are under your control. The following factors may make you more likely to develop this condition:  Smoking.  Having type 2 diabetes mellitus, high cholesterol, or both.  Not getting enough exercise or physical activity.  Being overweight.  Having too much fat, sugar, calories, or salt (sodium) in your diet.  Drinking too much alcohol. Some risk factors for high blood pressure may be difficult or impossible to change. Some of these factors include:  Having chronic kidney disease.  Having a family history of high blood pressure.  Age. Risk increases with age.  Race. You may be at higher risk if you are African American.  Gender. Men are at higher risk than women before age 45. After age 65, women are at higher risk than men.  Having obstructive sleep apnea.  Stress. What are the signs or symptoms? High blood pressure may not cause symptoms. Very high blood pressure (hypertensive crisis) may cause:  Headache.  Anxiety.  Shortness of breath.  Nosebleed.  Nausea and vomiting.  Vision changes.  Severe chest pain.  Seizures. How is this diagnosed? This condition is diagnosed by measuring your blood pressure while you are seated, with your arm resting on a flat surface, your legs uncrossed, and your feet flat on the floor. The cuff of the blood pressure monitor will be placed directly against the skin of your upper arm at the level of your   heart. It should be measured at least twice using the same arm. Certain conditions can cause a difference in blood pressure between your right and left arms. Certain factors can cause blood pressure readings to be lower or higher than normal for a short period of time:  When your blood pressure is higher when you are in a health care provider's office than when you are at home, this is called white coat hypertension.  Most people with this condition do not need medicines.  When your blood pressure is higher at home than when you are in a health care provider's office, this is called masked hypertension. Most people with this condition may need medicines to control blood pressure. If you have a high blood pressure reading during one visit or you have normal blood pressure with other risk factors, you may be asked to:  Return on a different day to have your blood pressure checked again.  Monitor your blood pressure at home for 1 week or longer. If you are diagnosed with hypertension, you may have other blood or imaging tests to help your health care provider understand your overall risk for other conditions. How is this treated? This condition is treated by making healthy lifestyle changes, such as eating healthy foods, exercising more, and reducing your alcohol intake. Your health care provider may prescribe medicine if lifestyle changes are not enough to get your blood pressure under control, and if:  Your systolic blood pressure is above 130.  Your diastolic blood pressure is above 80. Your personal target blood pressure may vary depending on your medical conditions, your age, and other factors. Follow these instructions at home: Eating and drinking   Eat a diet that is high in fiber and potassium, and low in sodium, added sugar, and fat. An example eating plan is called the DASH (Dietary Approaches to Stop Hypertension) diet. To eat this way: ? Eat plenty of fresh fruits and vegetables. Try to fill one half of your plate at each meal with fruits and vegetables. ? Eat whole grains, such as whole-wheat pasta, brown rice, or whole-grain bread. Fill about one fourth of your plate with whole grains. ? Eat or drink low-fat dairy products, such as skim milk or low-fat yogurt. ? Avoid fatty cuts of meat, processed or cured meats, and poultry with skin. Fill about one fourth of your plate with lean proteins, such  as fish, chicken without skin, beans, eggs, or tofu. ? Avoid pre-made and processed foods. These tend to be higher in sodium, added sugar, and fat.  Reduce your daily sodium intake. Most people with hypertension should eat less than 1,500 mg of sodium a day.  Do not drink alcohol if: ? Your health care provider tells you not to drink. ? You are pregnant, may be pregnant, or are planning to become pregnant.  If you drink alcohol: ? Limit how much you use to:  0-1 drink a day for women.  0-2 drinks a day for men. ? Be aware of how much alcohol is in your drink. In the [place] one drink equals one 12 oz bottle of beer (355 mL), one 5 oz glass of wine (148 mL), or one 1 oz glass of hard liquor (44 mL). Lifestyle   Work with your health care provider to maintain a healthy body weight or to lose weight. Ask what an ideal weight is for you.  Get at least 30 minutes of exercise most days of the week. Activities may include walking, swimming,   or biking.  Include exercise to strengthen your muscles (resistance exercise), such as Pilates or lifting weights, as part of your weekly exercise routine. Try to do these types of exercises for 30 minutes at least 3 days a week.  Do not use any products that contain nicotine or tobacco, such as cigarettes, e-cigarettes, and chewing tobacco. If you need help quitting, ask your health care provider.  Monitor your blood pressure at home as told by your health care provider.  Keep all follow-up visits as told by your health care provider. This is important. Medicines  Take over-the-counter and prescription medicines only as told by your health care provider. Follow directions carefully. Blood pressure medicines must be taken as prescribed.  Do not skip doses of blood pressure medicine. Doing this puts you at risk for problems and can make the medicine less effective.  Ask your health care provider about side effects or reactions to medicines that you  should watch for. Contact a health care provider if you:  Think you are having a reaction to a medicine you are taking.  Have headaches that keep coming back (recurring).  Feel dizzy.  Have swelling in your ankles.  Have trouble with your vision. Get help right away if you:  Develop a severe headache or confusion.  Have unusual weakness or numbness.  Feel faint.  Have severe pain in your chest or abdomen.  Vomit repeatedly.  Have trouble breathing. Summary  Hypertension is when the force of blood pumping through your arteries is too strong. If this condition is not controlled, it may put you at risk for serious complications.  Your personal target blood pressure may vary depending on your medical conditions, your age, and other factors. For most people, a normal blood pressure is less than 120/80.  Hypertension is treated with lifestyle changes, medicines, or a combination of both. Lifestyle changes include losing weight, eating a healthy, low-sodium diet, exercising more, and limiting alcohol. This information is not intended to replace advice given to you by your health care provider. Make sure you discuss any questions you have with your health care provider. Document Revised: 05/21/2018 Document Reviewed: 05/21/2018 Elsevier Patient Education  2020 [employer]  

## 2020-11-18 DIAGNOSIS — N183 Chronic kidney disease, stage 3 unspecified: Secondary | ICD-10-CM | POA: Diagnosis not present

## 2020-12-01 DIAGNOSIS — D631 Anemia in chronic kidney disease: Secondary | ICD-10-CM | POA: Diagnosis not present

## 2020-12-01 DIAGNOSIS — N2581 Secondary hyperparathyroidism of renal origin: Secondary | ICD-10-CM | POA: Diagnosis not present

## 2020-12-01 DIAGNOSIS — N1831 Chronic kidney disease, stage 3a: Secondary | ICD-10-CM | POA: Diagnosis not present

## 2020-12-01 DIAGNOSIS — I129 Hypertensive chronic kidney disease with stage 1 through stage 4 chronic kidney disease, or unspecified chronic kidney disease: Secondary | ICD-10-CM | POA: Diagnosis not present

## 2020-12-02 ENCOUNTER — Other Ambulatory Visit: Payer: Self-pay

## 2020-12-06 ENCOUNTER — Ambulatory Visit: Payer: BC Managed Care – PPO | Admitting: Endocrinology

## 2021-01-09 DIAGNOSIS — I129 Hypertensive chronic kidney disease with stage 1 through stage 4 chronic kidney disease, or unspecified chronic kidney disease: Secondary | ICD-10-CM | POA: Diagnosis not present

## 2021-02-18 IMAGING — CR RIGHT ANKLE - COMPLETE 3+ VIEW
3 series · 3 of 3 positions shown · non-contrast
Comparison: None.

CLINICAL DATA: Recent trip and fall with ankle pain, initial
encounter

EXAM:
RIGHT ANKLE - COMPLETE 3+ VIEW

[ap]
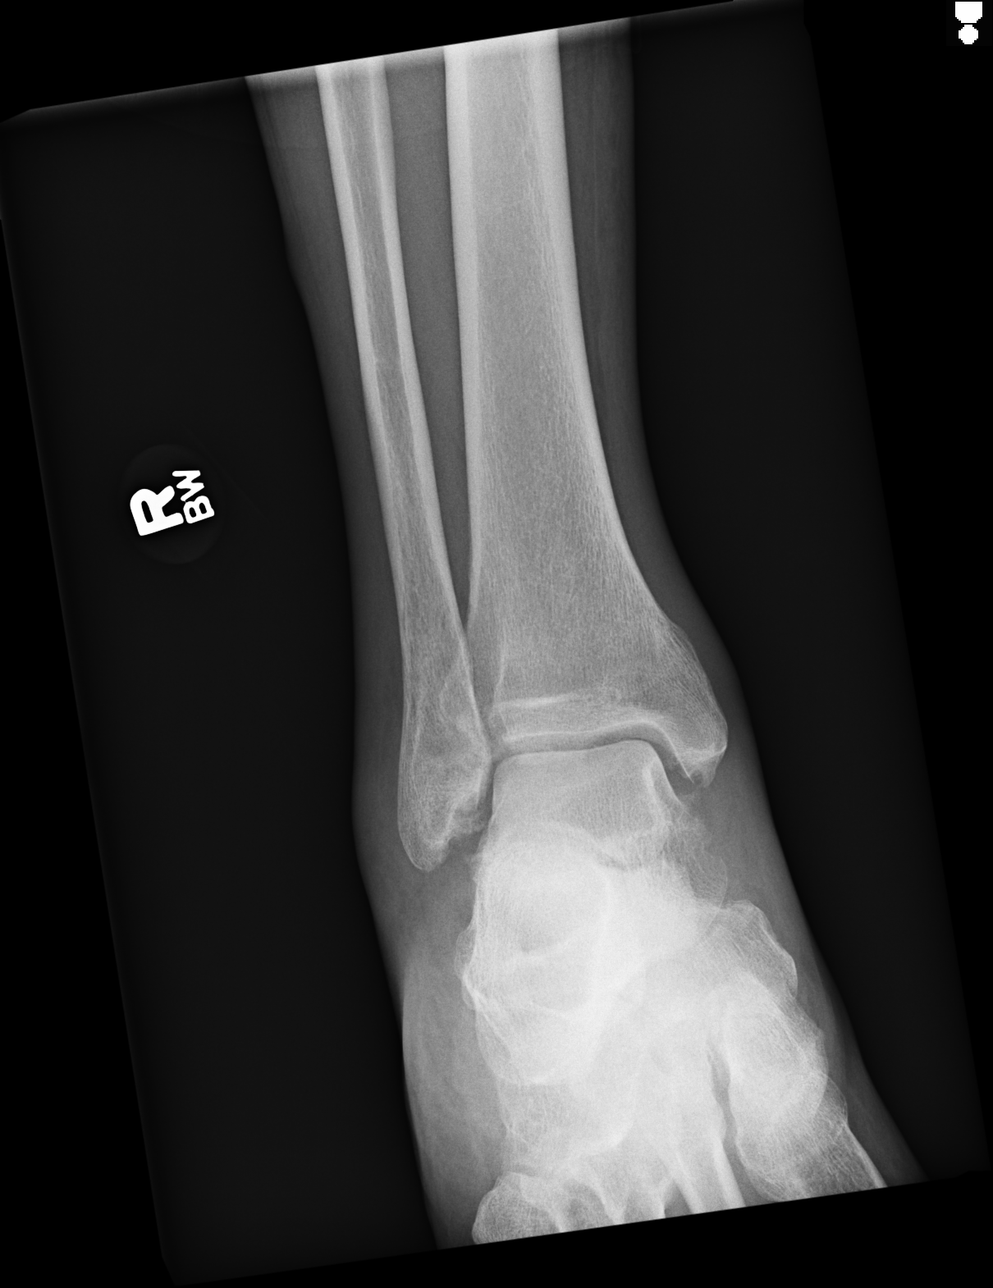

[lat]
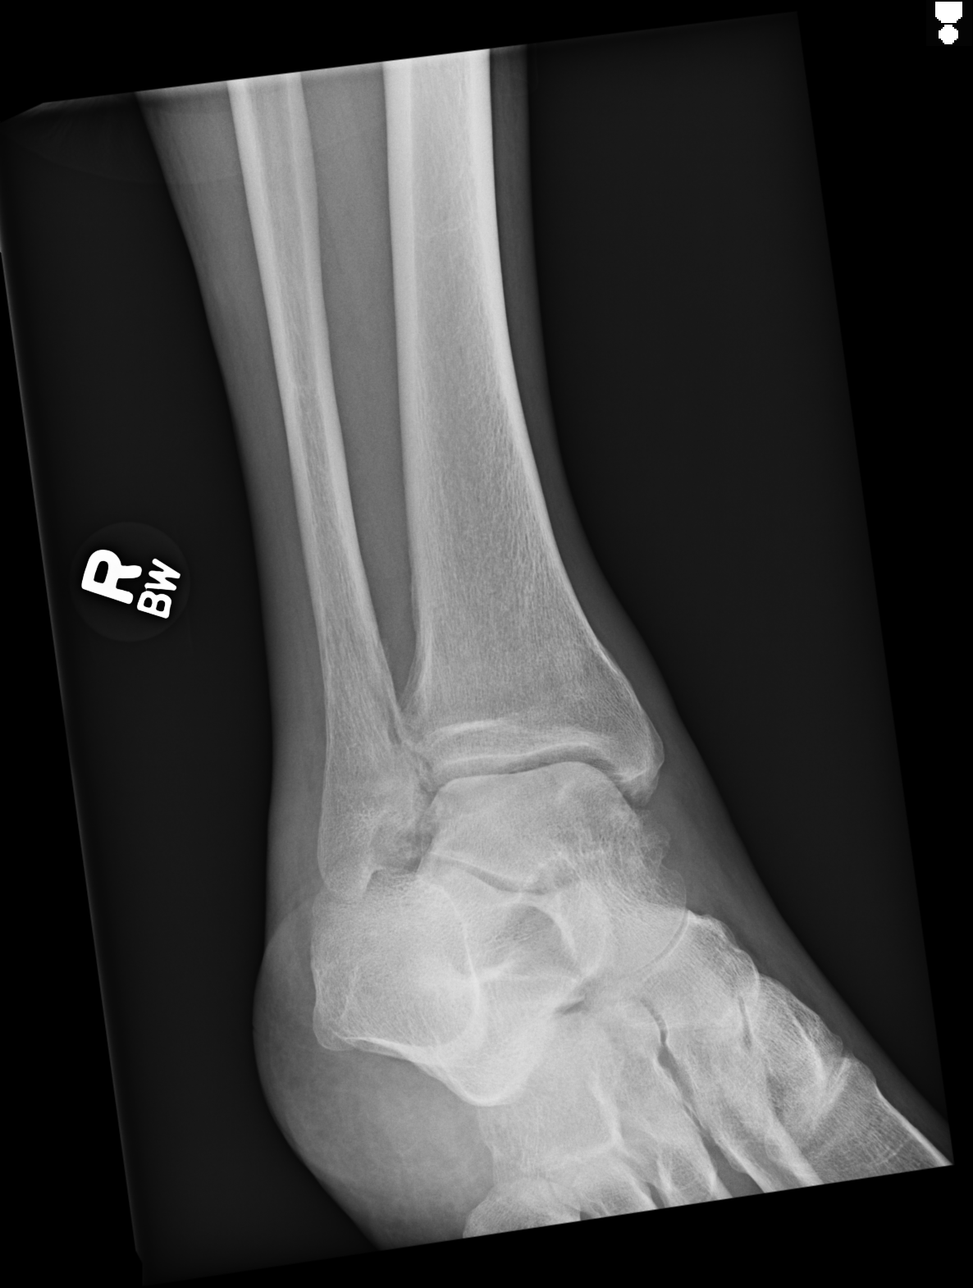

[oblique]
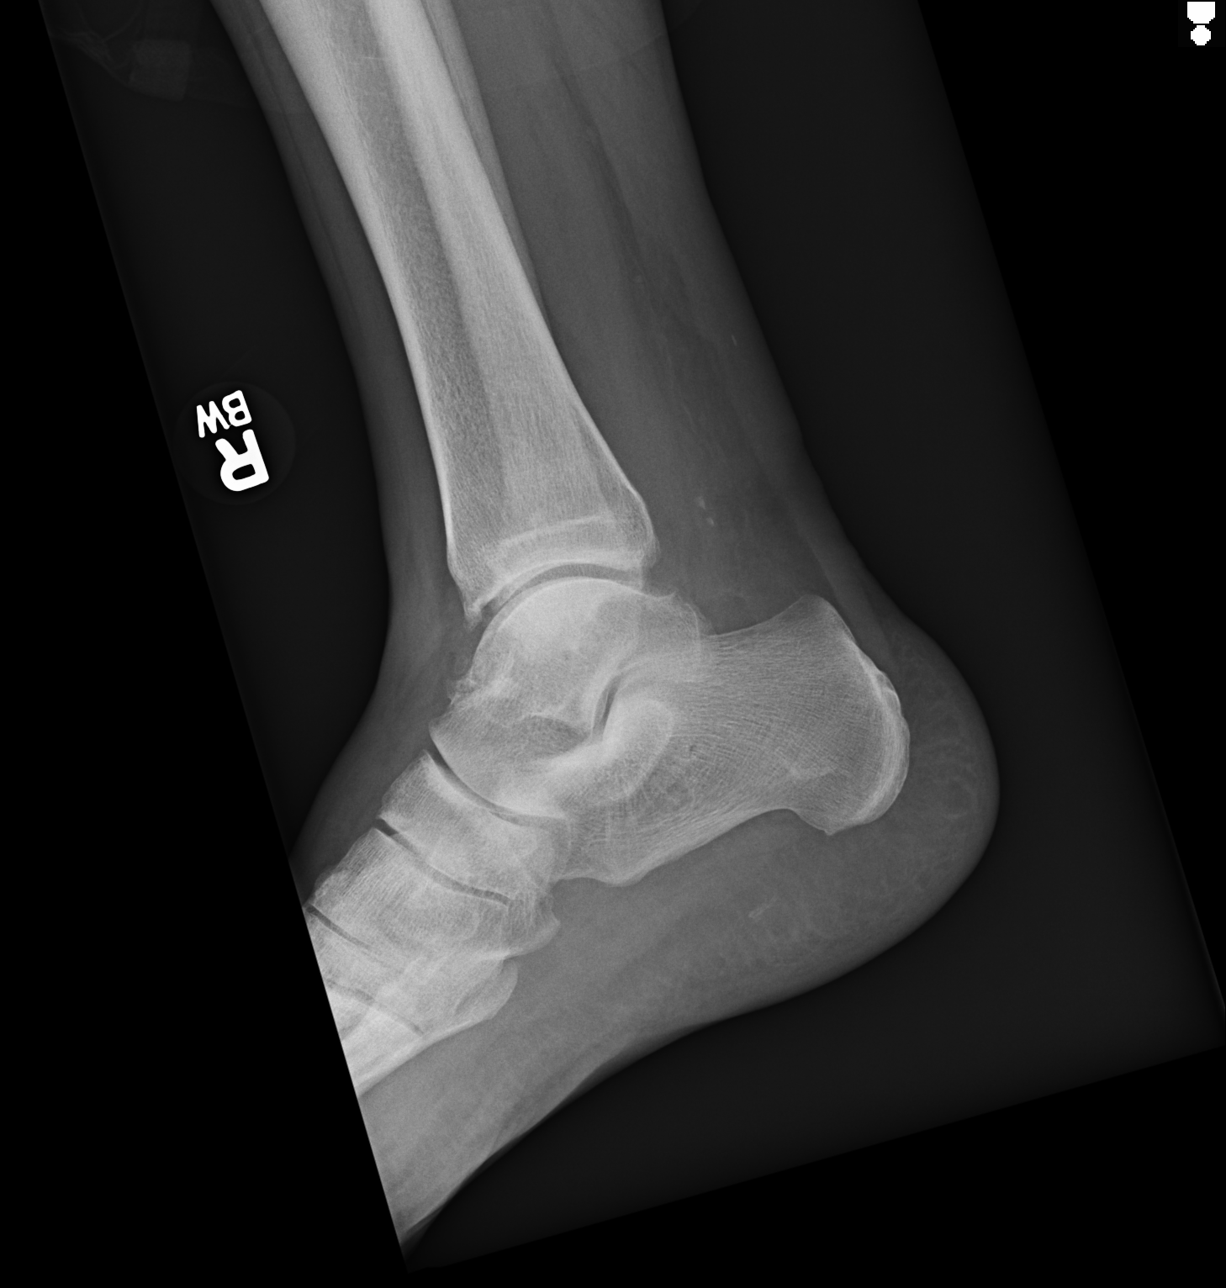

[3 of 3 positions shown; findings below may reference images not displayed]

FINDINGS: Mild soft tissue swelling is noted about the ankle joint. There are
changes in the distal fibula suggestive of undisplaced fracture with
a degree of healing. Mild degenerative changes of the tibiotalar
joint are seen. No other soft tissue abnormality is noted.
IMPRESSION: Changes consistent with prior distal fibular fracture with mild
healing. No other focal abnormality is seen.

## 2021-02-26 ENCOUNTER — Ambulatory Visit
Admission: EM | Admit: 2021-02-26 | Discharge: 2021-02-26 | Disposition: A | Discharge status: Home / Self Care | Payer: BC Managed Care – PPO | Attending: Family Medicine | Admitting: Family Medicine

## 2021-02-26 DIAGNOSIS — R1032 Left lower quadrant pain: Secondary | ICD-10-CM

## 2021-02-26 LAB — POCT URINALYSIS DIP (MANUAL ENTRY)
Bilirubin, UA: NEGATIVE
Blood, UA: NEGATIVE
Glucose, UA: NEGATIVE mg/dL
Ketones, POC UA: NEGATIVE mg/dL
Leukocytes, UA: NEGATIVE
Nitrite, UA: NEGATIVE
Protein Ur, POC: NEGATIVE mg/dL
Spec Grav, UA: 1.02 (ref 1.010–1.025)
Urobilinogen, UA: 0.2 E.U./dL
pH, UA: 6.5 (ref 5.0–8.0)

## 2021-02-26 NOTE — ED Triage Notes (Addendum)
Patient presents to Urgent Care with complaints of intermittent lower abdominal and lower back pain x 3 weeks. Treating with tylenol and ibuprofen. Pt states he has a hx of kidney stones. States at this time no pain.   Denies fever or urinary symptoms or any changes in activity/work.

## 2021-02-26 NOTE — Discharge Instructions (Signed)
Follow-up with primary care regarding lower quadrant pain.
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
## 2021-03-01 DIAGNOSIS — R109 Unspecified abdominal pain: Secondary | ICD-10-CM | POA: Diagnosis not present

## 2021-03-01 DIAGNOSIS — E0590 Thyrotoxicosis, unspecified without thyrotoxic crisis or storm: Secondary | ICD-10-CM | POA: Diagnosis not present

## 2021-03-01 DIAGNOSIS — Z1159 Encounter for screening for other viral diseases: Secondary | ICD-10-CM | POA: Diagnosis not present

## 2021-03-01 DIAGNOSIS — I10 Essential (primary) hypertension: Secondary | ICD-10-CM | POA: Diagnosis not present

## 2021-03-01 DIAGNOSIS — Z131 Encounter for screening for diabetes mellitus: Secondary | ICD-10-CM | POA: Diagnosis not present

## 2021-03-01 DIAGNOSIS — Z114 Encounter for screening for human immunodeficiency virus [HIV]: Secondary | ICD-10-CM | POA: Diagnosis not present

## 2021-03-01 DIAGNOSIS — Z125 Encounter for screening for malignant neoplasm of prostate: Secondary | ICD-10-CM | POA: Diagnosis not present

## 2021-03-01 DIAGNOSIS — Z87442 Personal history of urinary calculi: Secondary | ICD-10-CM | POA: Diagnosis not present

## 2021-03-08 DIAGNOSIS — N183 Chronic kidney disease, stage 3 unspecified: Secondary | ICD-10-CM | POA: Diagnosis not present

## 2021-03-09 ENCOUNTER — Emergency Department (HOSPITAL_COMMUNITY)
Admission: EM | Admit: 2021-03-09 | Discharge: 2021-03-09 | Disposition: A | Discharge status: Home / Self Care | Payer: BC Managed Care – PPO | Attending: Emergency Medicine | Admitting: Emergency Medicine

## 2021-03-09 ENCOUNTER — Other Ambulatory Visit: Payer: Self-pay

## 2021-03-09 ENCOUNTER — Encounter (HOSPITAL_COMMUNITY): Payer: Self-pay

## 2021-03-09 ENCOUNTER — Emergency Department (HOSPITAL_COMMUNITY): Payer: BC Managed Care – PPO

## 2021-03-09 DIAGNOSIS — S39011A Strain of muscle, fascia and tendon of abdomen, initial encounter: Secondary | ICD-10-CM | POA: Diagnosis not present

## 2021-03-09 DIAGNOSIS — S76219A Strain of adductor muscle, fascia and tendon of unspecified thigh, initial encounter: Secondary | ICD-10-CM | POA: Diagnosis not present

## 2021-03-09 DIAGNOSIS — F17210 Nicotine dependence, cigarettes, uncomplicated: Secondary | ICD-10-CM | POA: Diagnosis not present

## 2021-03-09 DIAGNOSIS — N200 Calculus of kidney: Secondary | ICD-10-CM | POA: Diagnosis not present

## 2021-03-09 DIAGNOSIS — Z7982 Long term (current) use of aspirin: Secondary | ICD-10-CM | POA: Insufficient documentation

## 2021-03-09 DIAGNOSIS — S3991XA Unspecified injury of abdomen, initial encounter: Secondary | ICD-10-CM | POA: Diagnosis not present

## 2021-03-09 DIAGNOSIS — X58XXXA Exposure to other specified factors, initial encounter: Secondary | ICD-10-CM | POA: Insufficient documentation

## 2021-03-09 DIAGNOSIS — N2881 Hypertrophy of kidney: Secondary | ICD-10-CM | POA: Diagnosis not present

## 2021-03-09 DIAGNOSIS — N1831 Chronic kidney disease, stage 3a: Secondary | ICD-10-CM | POA: Insufficient documentation

## 2021-03-09 DIAGNOSIS — Z79899 Other long term (current) drug therapy: Secondary | ICD-10-CM | POA: Diagnosis not present

## 2021-03-09 DIAGNOSIS — N261 Atrophy of kidney (terminal): Secondary | ICD-10-CM | POA: Diagnosis not present

## 2021-03-09 DIAGNOSIS — D3500 Benign neoplasm of unspecified adrenal gland: Secondary | ICD-10-CM | POA: Diagnosis not present

## 2021-03-09 DIAGNOSIS — I129 Hypertensive chronic kidney disease with stage 1 through stage 4 chronic kidney disease, or unspecified chronic kidney disease: Secondary | ICD-10-CM | POA: Insufficient documentation

## 2021-03-09 MED ORDER — IBUPROFEN 800 MG PO TABS: 800.0000 mg | ORAL_TABLET | Freq: Three times a day (TID) | ORAL | 1 refills | Status: AC | PRN
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
## 2021-03-09 NOTE — Discharge Instructions (Addendum)
Follow-up with your family doctor or Dr. Arnoldo Morale for recheck in a couple weeks.  Take Motrin as needed for discomfort

## 2021-03-09 NOTE — ED Notes (Signed)
See providers assessment.  

## 2021-03-09 NOTE — ED Triage Notes (Signed)
Pt presents to ED with complaints of left sided lower abdominal pain shoots down to groin x couple months. Denies N/V/D

## 2021-03-20 ENCOUNTER — Ambulatory Visit: Payer: BC Managed Care – PPO | Admitting: Emergency Medicine

## 2021-03-20 ENCOUNTER — Other Ambulatory Visit: Payer: Self-pay

## 2021-03-20 ENCOUNTER — Encounter: Payer: Self-pay | Admitting: Emergency Medicine

## 2021-03-20 VITALS — BP 138/80 | HR 64 | Temp 98.4°F | Ht 66.0 in | Wt 195.8 lb

## 2021-03-20 DIAGNOSIS — N1831 Chronic kidney disease, stage 3a: Secondary | ICD-10-CM | POA: Diagnosis not present

## 2021-03-20 DIAGNOSIS — I10 Essential (primary) hypertension: Secondary | ICD-10-CM

## 2021-03-20 DIAGNOSIS — I700 Atherosclerosis of aorta: Secondary | ICD-10-CM | POA: Insufficient documentation

## 2021-03-20 DIAGNOSIS — M549 Dorsalgia, unspecified: Secondary | ICD-10-CM

## 2021-03-20 DIAGNOSIS — E0590 Thyrotoxicosis, unspecified without thyrotoxic crisis or storm: Secondary | ICD-10-CM

## 2021-03-20 DIAGNOSIS — F17200 Nicotine dependence, unspecified, uncomplicated: Secondary | ICD-10-CM

## 2021-03-20 DIAGNOSIS — E785 Hyperlipidemia, unspecified: Secondary | ICD-10-CM

## 2021-03-20 DIAGNOSIS — M62830 Muscle spasm of back: Secondary | ICD-10-CM

## 2021-03-20 MED ORDER — CYCLOBENZAPRINE HCL 10 MG PO TABS: 10.0000 mg | ORAL_TABLET | Freq: Three times a day (TID) | ORAL | 0 refills | Status: AC | PRN

## 2021-03-20 MED ORDER — TRAMADOL HCL 50 MG PO TABS: 50.0000 mg | ORAL_TABLET | Freq: Three times a day (TID) | ORAL | 0 refills | Status: AC | PRN

## 2021-03-20 NOTE — Assessment & Plan Note (Signed)
Well-controlled hypertension.  Continue Zestoretic 20-25 mg daily.

## 2021-03-20 NOTE — Assessment & Plan Note (Signed)
Advised to stay well-hydrated and avoid NSAIDs as much as possible.

## 2021-03-20 NOTE — Assessment & Plan Note (Signed)
Diet and nutrition discussed. Continue atorvastatin 40 mg daily. 

## 2021-03-20 NOTE — Assessment & Plan Note (Signed)
Clinical picture and physical findings suggestive of mechanical and not inflammatory pain.  Recommend combination of Flexeril and tramadol as needed.  Taken off work for 1 week.  Will consider physical therapy if no better in 1 to 2 weeks.

## 2021-03-20 NOTE — Assessment & Plan Note (Signed)
Clinically euthyroid.  Continue Tapazole 5 mg daily.
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
## 2021-03-20 NOTE — Patient Instructions (Signed)
Acute Back Pain, Adult Acute back pain is sudden and usually short-lived. It is often caused by an injury to the muscles and tissues in the back. The injury may result from: A muscle or ligament getting overstretched or torn (strained). Ligaments are tissues that connect bones to each other. Lifting something improperly can cause a back strain. Wear and tear (degeneration) of the spinal disks. Spinal disks are circular tissue that provide cushioning between the bones of the spine (vertebrae). Twisting motions, such as while playing sports or doing yard work. A hit to the back. Arthritis. You may have a physical exam, lab tests, and imaging tests to find the cause ofyour pain. Acute back pain usually goes away with rest and home care. Follow these instructions at home: Managing pain, stiffness, and swelling Treatment may include medicines for pain and inflammation that are taken by mouth or applied to the skin, prescription pain medicine, or muscle relaxants. Take over-the-counter and prescription medicines only as told by your health care provider. Your health care provider may recommend applying ice during the first 24-48 hours after your pain starts. To do this: Put ice in a plastic bag. Place a towel between your skin and the bag. Leave the ice on for 20 minutes, 2-3 times a day. If directed, apply heat to the affected area as often as told by your health care provider. Use the heat source that your health care provider recommends, such as a moist heat pack or a heating pad. Place a towel between your skin and the heat source. Leave the heat on for 20-30 minutes. Remove the heat if your skin turns bright red. This is especially important if you are unable to feel pain, heat, or cold. You have a greater risk of getting burned. Activity  Do not stay in bed. Staying in bed for more than 1-2 days can delay your recovery. Sit up and stand up straight. Avoid leaning forward when you sit or  hunching over when you stand. If you work at a desk, sit close to it so you do not need to lean over. Keep your chin tucked in. Keep your neck drawn back, and keep your elbows bent at a 90-degree angle (right angle). Sit high and close to the steering wheel when you drive. Add lower back (lumbar) support to your car seat, if needed. Take short walks on even surfaces as soon as you are able. Try to increase the length of time you walk each day. Do not sit, drive, or stand in one place for more than 30 minutes at a time. Sitting or standing for long periods of time can put stress on your back. Do not drive or use heavy machinery while taking prescription pain medicine. Use proper lifting techniques. When you bend and lift, use positions that put less stress on your back: Bend your knees. Keep the load close to your body. Avoid twisting. Exercise regularly as told by your health care provider. Exercising helps your back heal faster and helps prevent back injuries by keeping muscles strong and flexible. Work with a physical therapist to make a safe exercise program, as recommended by your health care provider. Do any exercises as told by your physical therapist.  Lifestyle Maintain a healthy weight. Extra weight puts stress on your back and makes it difficult to have good posture. Avoid activities or situations that make you feel anxious or stressed. Stress and anxiety increase muscle tension and can make back pain worse. Learn ways to manage   anxiety and stress, such as through exercise. General instructions Sleep on a firm mattress in a comfortable position. Try lying on your side with your knees slightly bent. If you lie on your back, put a pillow under your knees. Follow your treatment plan as told by your health care provider. This may include: Cognitive or behavioral therapy. Acupuncture or massage therapy. Meditation or yoga. Contact a health care provider if: You have pain that is not  relieved with rest or medicine. You have increasing pain going down into your legs or buttocks. Your pain does not improve after 2 weeks. You have pain at night. You lose weight without trying. You have a fever or chills. Get help right away if: You develop new bowel or bladder control problems. You have unusual weakness or numbness in your arms or legs. You develop nausea or vomiting. You develop abdominal pain. You feel faint. Summary Acute back pain is sudden and usually short-lived. Use proper lifting techniques. When you bend and lift, use positions that put less stress on your back. Take over-the-counter and prescription medicines and apply heat or ice as directed by your health care provider. This information is not intended to replace advice given to you by your health care provider. Make sure you discuss any questions you have with your healthcare provider. Document Revised: 05/31/2020 Document Reviewed: 06/03/2020 Elsevier Patient Education  2022 Elsevier Inc.  

## 2021-03-20 NOTE — Assessment & Plan Note (Signed)
Cardiovascular and cancer risks associated with smoking discussed with patient. Smoking cessation advice given.  Not ready to quit.

## 2021-05-25 ENCOUNTER — Other Ambulatory Visit: Payer: Self-pay | Admitting: Endocrinology

## 2021-05-25 DIAGNOSIS — E0590 Thyrotoxicosis, unspecified without thyrotoxic crisis or storm: Secondary | ICD-10-CM

## 2021-05-26 NOTE — Telephone Encounter (Signed)
  Notes to clinic:  Patient was a no show to appt on 12/06/2020 Last office visit was 06/02/2020 No future appt schedule  Please advise    Requested Prescriptions  Pending Prescriptions Disp Refills   methimazole (TAPAZOLE) 5 MG tablet [Pharmacy Med Name: methIMAzole 5 MG Oral Tablet] 90 tablet 0    Sig: Take 1 tablet by mouth once daily     There is no refill protocol information for this order

## 2021-05-30 NOTE — Telephone Encounter (Signed)
Patient scheduled for 07/07/2021 at 4:15pm. 2 month supply sent

## 2021-06-06 ENCOUNTER — Ambulatory Visit: Payer: BC Managed Care – PPO | Admitting: Orthopedic Surgery

## 2021-06-06 ENCOUNTER — Ambulatory Visit: Payer: Self-pay

## 2021-06-06 ENCOUNTER — Other Ambulatory Visit: Payer: Self-pay

## 2021-06-06 ENCOUNTER — Encounter: Payer: Self-pay | Admitting: Orthopedic Surgery

## 2021-06-06 VITALS — BP 150/89 | HR 68 | Ht 66.0 in | Wt 190.0 lb

## 2021-06-06 DIAGNOSIS — M79641 Pain in right hand: Secondary | ICD-10-CM

## 2021-06-06 DIAGNOSIS — M19031 Primary osteoarthritis, right wrist: Secondary | ICD-10-CM

## 2021-06-06 DIAGNOSIS — M1811 Unilateral primary osteoarthritis of first carpometacarpal joint, right hand: Secondary | ICD-10-CM

## 2021-06-06 DIAGNOSIS — G5601 Carpal tunnel syndrome, right upper limb: Secondary | ICD-10-CM | POA: Diagnosis not present

## 2021-06-06 MED ORDER — LIDOCAINE HCL 1 % IJ SOLN
1.0000 mL | INTRAMUSCULAR | Status: AC | PRN
  Administered 2021-06-06: 1 mL

## 2021-06-06 MED ORDER — BETAMETHASONE SOD PHOS & ACET 6 (3-3) MG/ML IJ SUSP
6.0000 mg | INTRAMUSCULAR | Status: AC | PRN
  Administered 2021-06-06: 6 mg via INTRA_ARTICULAR
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
## 2021-07-04 ENCOUNTER — Other Ambulatory Visit: Payer: Self-pay

## 2021-07-04 ENCOUNTER — Encounter: Payer: Self-pay | Admitting: Orthopedic Surgery

## 2021-07-04 ENCOUNTER — Ambulatory Visit (INDEPENDENT_AMBULATORY_CARE_PROVIDER_SITE_OTHER): Payer: BC Managed Care – PPO | Admitting: Orthopedic Surgery

## 2021-07-04 DIAGNOSIS — M1811 Unilateral primary osteoarthritis of first carpometacarpal joint, right hand: Secondary | ICD-10-CM | POA: Diagnosis not present

## 2021-07-04 DIAGNOSIS — M19031 Primary osteoarthritis, right wrist: Secondary | ICD-10-CM | POA: Diagnosis not present

## 2021-07-04 MED ORDER — DICLOFENAC SODIUM 1 % EX GEL: 2.0000 g | Freq: Four times a day (QID) | CUTANEOUS | Status: AC
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
## 2021-07-06 ENCOUNTER — Encounter: Payer: BC Managed Care – PPO | Admitting: Occupational Therapy

## 2021-07-07 ENCOUNTER — Other Ambulatory Visit: Payer: Self-pay

## 2021-07-07 ENCOUNTER — Ambulatory Visit (INDEPENDENT_AMBULATORY_CARE_PROVIDER_SITE_OTHER): Payer: BC Managed Care – PPO | Admitting: Endocrinology

## 2021-07-07 VITALS — BP 150/82 | HR 71 | Ht 66.0 in | Wt 192.6 lb

## 2021-07-07 DIAGNOSIS — E0590 Thyrotoxicosis, unspecified without thyrotoxic crisis or storm: Secondary | ICD-10-CM | POA: Diagnosis not present
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
## 2021-07-07 NOTE — Patient Instructions (Signed)
Your blood pressure is high today.  Please see your primary care provider soon, to have it rechecked Blood tests are requested for you today.  We'll let you know about the results.  If ever you have fever while taking methimazole, stop it and call us, even if the reason is obvious, because of the risk of a rare side-effect.  It is best to never miss the medication.  However, if you do miss it, next best is to double up the next time.   Please come back for a follow-up appointment in 6 months.

## 2021-07-08 LAB — T4, FREE: Free T4: 1.22 ng/dL (ref 0.82–1.77)

## 2021-07-08 LAB — TSH: TSH: 2.17 u[IU]/mL (ref 0.450–4.500)

## 2021-07-10 DIAGNOSIS — M67331 Transient synovitis, right wrist: Secondary | ICD-10-CM | POA: Diagnosis not present

## 2021-07-11 ENCOUNTER — Encounter: Payer: BC Managed Care – PPO | Admitting: Occupational Therapy

## 2021-07-18 ENCOUNTER — Other Ambulatory Visit: Payer: Self-pay

## 2021-07-18 ENCOUNTER — Telehealth: Payer: Self-pay | Admitting: Orthopedic Surgery

## 2021-07-18 ENCOUNTER — Encounter: Payer: BC Managed Care – PPO | Admitting: Occupational Therapy

## 2021-07-18 NOTE — Telephone Encounter (Signed)
Pt came for 1145 OT appt and cancelled it saying Dr.Benfield diagnosed him wrong. He states that he went and got a second opinion and they said he had gout in his hand. He would like Dr.Benfield to give him a call.   CB [phone]

## 2021-07-31 DIAGNOSIS — M67331 Transient synovitis, right wrist: Secondary | ICD-10-CM | POA: Diagnosis not present

## 2021-08-14 ENCOUNTER — Ambulatory Visit (INDEPENDENT_AMBULATORY_CARE_PROVIDER_SITE_OTHER): Payer: BC Managed Care – PPO | Admitting: Emergency Medicine

## 2021-08-14 ENCOUNTER — Encounter: Payer: Self-pay | Admitting: Emergency Medicine

## 2021-08-14 ENCOUNTER — Other Ambulatory Visit: Payer: Self-pay

## 2021-08-14 VITALS — BP 132/70 | HR 70 | Temp 97.9°F | Ht 66.0 in | Wt 194.0 lb

## 2021-08-14 DIAGNOSIS — N1831 Chronic kidney disease, stage 3a: Secondary | ICD-10-CM

## 2021-08-14 DIAGNOSIS — I10 Essential (primary) hypertension: Secondary | ICD-10-CM

## 2021-08-14 DIAGNOSIS — I700 Atherosclerosis of aorta: Secondary | ICD-10-CM | POA: Diagnosis not present

## 2021-08-14 DIAGNOSIS — M109 Gout, unspecified: Secondary | ICD-10-CM

## 2021-08-14 LAB — HEMOGLOBIN A1C: Hgb A1c MFr Bld: 5.9 % (ref 4.6–6.5)

## 2021-08-14 MED ORDER — ALLOPURINOL 100 MG PO TABS
50.0000 mg | ORAL_TABLET | Freq: Every day | ORAL | 3 refills | Status: AC
Start: 2021-08-14 — End: 2023-08-01

## 2021-08-14 NOTE — Assessment & Plan Note (Signed)
Much improved from recent episode. Patient has history of CKD stage III a Blood work done today including uric acid. Will start allopurinol 50 mg daily. Diet and nutrition discussed. Work note for [employer] duty provided.

## 2021-08-14 NOTE — Assessment & Plan Note (Signed)
Diet and nutrition discussed. Continue atorvastatin 40 mg daily. 
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
## 2021-08-14 NOTE — Assessment & Plan Note (Signed)
Well-controlled hypertension.  Continue Zestoretic 20-25 mg daily Dietary approaches to stop hypertension discussed.

## 2021-08-14 NOTE — Patient Instructions (Signed)
Gout ?Gout is painful swelling of your joints. Gout is a type of arthritis. It is caused by having too much uric acid in your body. Uric acid is a chemical that is made when your body breaks down substances called purines. If your body has too much uric acid, sharp crystals can form and build up in your joints. This causes pain and swelling. ?Gout attacks can happen quickly and be very painful (acute gout). Over time, the attacks can affect more joints and happen more often (chronic gout). ?What are the causes? ?Too much uric acid in your blood. This can happen because: ?Your kidneys do not remove enough uric acid from your blood. ?Your body makes too much uric acid. ?You eat too many foods that are high in purines. These foods include organ meats, some seafood, and beer. ?Trauma or stress. ?What increases the risk? ?Having a family history of gout. ?Being male and middle-aged. ?Being male and having gone through menopause. ?Being very overweight (obese). ?Drinking alcohol, especially beer. ?Not having enough water in the body (being dehydrated). ?Losing weight too quickly. ?Having an organ transplant. ?Having lead poisoning. ?Taking certain medicines. ?Having kidney disease. ?Having a skin condition called psoriasis. ?What are the signs or symptoms? ?An attack of acute gout usually happens in just one joint. The most common place is the big toe. Attacks often start at night. Other joints that may be affected include joints of the feet, ankle, knee, fingers, wrist, or elbow. Symptoms of an attack may include: ?Very bad pain. ?Warmth. ?Swelling. ?Stiffness. ?Shiny, red, or purple skin. ?Tenderness. The affected joint may be very painful to touch. ?Chills and fever. ?Chronic gout may cause symptoms more often. More joints may be involved. You may also have white or yellow lumps (tophi) on your hands or feet or in other areas near your joints. ?How is this treated? ?Treatment for this condition has two phases:  treating an acute attack and preventing future attacks. ?Acute gout treatment may include: ?NSAIDs. ?Steroids. These are taken by mouth or injected into a joint. ?Colchicine. This medicine relieves pain and swelling. It can be given by mouth or through an IV tube. ?Preventive treatment may include: ?Taking small doses of NSAIDs or colchicine daily. ?Using a medicine that reduces uric acid levels in your blood. ?Making changes to your diet. You may need to see a food expert (dietitian) about what to eat and drink to prevent gout. ?Follow these instructions at home: ?During a gout attack ? ?If told, put ice on the painful area: ?Put ice in a plastic bag. ?Place a towel between your skin and the bag. ?Leave the ice on for 20 minutes, 2-3 times a day. ?Raise (elevate) the painful joint above the level of your heart as often as you can. ?Rest the joint as much as possible. If the joint is in your leg, you may be given crutches. ?Follow instructions from your doctor about what you cannot eat or drink. ?Avoiding future gout attacks ?Eat a low-purine diet. Avoid foods and drinks such as: ?Liver. ?Kidney. ?Anchovies. ?Asparagus. ?Herring. ?Mushrooms. ?Mussels. ?Beer. ?Stay at a healthy weight. If you want to lose weight, talk with your doctor. Do not lose weight too fast. ?Start or continue an exercise plan as told by your doctor. ?Eating and drinking ?Drink enough fluids to keep your pee (urine) pale yellow. ?If you drink alcohol: ?Limit how much you use to: ?0-1 drink a day for women. ?0-2 drinks a day for men. ?Be aware of   how much alcohol is in your drink. In the [place] one drink equals one 12 oz bottle of beer (355 mL), one 5 oz glass of wine (148 mL), or one 1? oz glass of hard liquor (44 mL). ?General instructions ?Take over-the-counter and prescription medicines only as told by your doctor. ?Do not drive or use heavy machinery while taking prescription pain medicine. ?Return to your normal activities as told by your  doctor. Ask your doctor what activities are safe for you. ?Keep all follow-up visits as told by your doctor. This is important. ?Contact a doctor if: ?You have another gout attack. ?You still have symptoms of a gout attack after 10 days of treatment. ?You have problems (side effects) because of your medicines. ?You have chills or a fever. ?You have burning pain when you pee (urinate). ?You have pain in your lower back or belly. ?Get help right away if: ?You have very bad pain. ?Your pain cannot be controlled. ?You cannot pee. ?Summary ?Gout is painful swelling of the joints. ?The most common site of pain is the big toe, but it can affect other joints. ?Medicines and avoiding some foods can help to prevent and treat gout attacks. ?This information is not intended to replace advice given to you by your health care provider. Make sure you discuss any questions you have with your health care provider. ?Document Revised: 03/21/2018 Document Reviewed: 04/02/2018 ?Elsevier Patient Education ? 2022 Elsevier Inc[employer] ? ?

## 2021-08-15 LAB — COMPREHENSIVE METABOLIC PANEL
ALT: 14 U/L (ref 0–53)
AST: 17 U/L (ref 0–37)
Albumin: 4.3 g/dL (ref 3.5–5.2)
Alkaline Phosphatase: 70 U/L (ref 39–117)
BUN: 16 mg/dL (ref 6–23)
CO2: 24 mEq/L (ref 19–32)
Calcium: 9.5 mg/dL (ref 8.4–10.5)
Chloride: 105 mEq/L (ref 96–112)
Creatinine, Ser: 1.7 mg/dL — ABNORMAL HIGH (ref 0.40–1.50)
GFR: 42.23 mL/min — ABNORMAL LOW (ref >60.00)
Glucose, Bld: 70 mg/dL (ref 70–99)
Potassium: 4.3 mEq/L (ref 3.5–5.1)
Sodium: 138 mEq/L (ref 135–145)
Total Bilirubin: 0.4 mg/dL (ref 0.2–1.2)
Total Protein: 6.7 g/dL (ref 6.0–8.3)

## 2021-08-15 LAB — LIPID PANEL
Cholesterol: 107 mg/dL (ref 0–200)
HDL: 33.6 mg/dL — ABNORMAL LOW (ref >39.00)
LDL Cholesterol: 47 mg/dL (ref 0–99)
NonHDL: 73.49
Total CHOL/HDL Ratio: 3
Triglycerides: 132 mg/dL (ref 0.0–149.0)
VLDL: 26.4 mg/dL (ref 0.0–40.0)

## 2021-08-15 LAB — URIC ACID: Uric Acid, Serum: 4.1 mg/dL (ref 4.0–7.8)

## 2021-11-08 ENCOUNTER — Other Ambulatory Visit: Payer: Self-pay | Admitting: Endocrinology

## 2021-11-08 DIAGNOSIS — E0590 Thyrotoxicosis, unspecified without thyrotoxic crisis or storm: Secondary | ICD-10-CM

## 2021-12-11 DIAGNOSIS — N1831 Chronic kidney disease, stage 3a: Secondary | ICD-10-CM | POA: Diagnosis not present

## 2021-12-21 DIAGNOSIS — N2581 Secondary hyperparathyroidism of renal origin: Secondary | ICD-10-CM | POA: Diagnosis not present

## 2021-12-21 DIAGNOSIS — I129 Hypertensive chronic kidney disease with stage 1 through stage 4 chronic kidney disease, or unspecified chronic kidney disease: Secondary | ICD-10-CM | POA: Diagnosis not present

## 2021-12-21 DIAGNOSIS — D631 Anemia in chronic kidney disease: Secondary | ICD-10-CM | POA: Diagnosis not present

## 2021-12-21 DIAGNOSIS — N1831 Chronic kidney disease, stage 3a: Secondary | ICD-10-CM | POA: Diagnosis not present

## 2022-01-08 ENCOUNTER — Encounter: Payer: Self-pay | Admitting: Endocrinology

## 2022-01-08 ENCOUNTER — Ambulatory Visit: Payer: BC Managed Care – PPO | Admitting: Endocrinology

## 2022-01-08 VITALS — BP 150/82 | HR 72 | Ht 66.0 in | Wt 191.0 lb

## 2022-01-08 DIAGNOSIS — E0590 Thyrotoxicosis, unspecified without thyrotoxic crisis or storm: Secondary | ICD-10-CM | POA: Diagnosis not present

## 2022-01-08 LAB — TSH: TSH: 1.87 u[IU]/mL (ref 0.35–5.50)

## 2022-01-08 LAB — T4, FREE: Free T4: 0.82 ng/dL (ref 0.60–1.60)

## 2022-01-08 NOTE — Patient Instructions (Addendum)
Your blood pressure is high today.  Please see your primary care provider soon, to have it rechecked.   ?Blood tests are requested for you today.  We'll let you know about the results.  ?If ever you have fever while taking methimazole, stop it and call us, even if the reason is obvious, because of the risk of a rare side-effect.  ?It is best to never miss the medication.  However, if you do miss it, next best is to double up the next time.   ?Please have a follow-up endocrinology appointment in 6 months.   ?
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
## 2022-03-05 ENCOUNTER — Other Ambulatory Visit: Payer: Self-pay | Admitting: Emergency Medicine

## 2022-03-05 DIAGNOSIS — I10 Essential (primary) hypertension: Secondary | ICD-10-CM

## 2022-03-05 DIAGNOSIS — E785 Hyperlipidemia, unspecified: Secondary | ICD-10-CM

## 2022-03-07 ENCOUNTER — Other Ambulatory Visit: Payer: Self-pay

## 2022-03-07 DIAGNOSIS — E0590 Thyrotoxicosis, unspecified without thyrotoxic crisis or storm: Secondary | ICD-10-CM

## 2022-03-07 MED ORDER — METHIMAZOLE 5 MG PO TABS: 5.0000 mg | ORAL_TABLET | Freq: Every day | ORAL | 3 refills | Status: DC

## 2022-07-26 ENCOUNTER — Emergency Department (HOSPITAL_COMMUNITY): Payer: BC Managed Care – PPO

## 2022-07-26 ENCOUNTER — Other Ambulatory Visit: Payer: Self-pay

## 2022-07-26 ENCOUNTER — Emergency Department (HOSPITAL_COMMUNITY)
Admission: EM | Admit: 2022-07-26 | Discharge: 2022-07-26 | Disposition: A | Discharge status: Home / Self Care | Payer: BC Managed Care – PPO | Attending: Emergency Medicine | Admitting: Emergency Medicine

## 2022-07-26 ENCOUNTER — Encounter (HOSPITAL_COMMUNITY): Payer: Self-pay | Admitting: *Deleted

## 2022-07-26 DIAGNOSIS — N200 Calculus of kidney: Secondary | ICD-10-CM | POA: Diagnosis not present

## 2022-07-26 DIAGNOSIS — R109 Unspecified abdominal pain: Secondary | ICD-10-CM | POA: Insufficient documentation

## 2022-07-26 DIAGNOSIS — D3501 Benign neoplasm of right adrenal gland: Secondary | ICD-10-CM | POA: Diagnosis not present

## 2022-07-26 DIAGNOSIS — Z87442 Personal history of urinary calculi: Secondary | ICD-10-CM | POA: Diagnosis not present

## 2022-07-26 DIAGNOSIS — R7989 Other specified abnormal findings of blood chemistry: Secondary | ICD-10-CM | POA: Insufficient documentation

## 2022-07-26 DIAGNOSIS — Z7982 Long term (current) use of aspirin: Secondary | ICD-10-CM | POA: Diagnosis not present

## 2022-07-26 DIAGNOSIS — N261 Atrophy of kidney (terminal): Secondary | ICD-10-CM | POA: Diagnosis not present

## 2022-07-26 DIAGNOSIS — K5730 Diverticulosis of large intestine without perforation or abscess without bleeding: Secondary | ICD-10-CM | POA: Diagnosis not present

## 2022-07-26 LAB — CBC WITH DIFFERENTIAL/PLATELET
Abs Immature Granulocytes: 0.02 10*3/uL (ref 0.00–0.07)
Basophils Absolute: 0 10*3/uL (ref 0.0–0.1)
Basophils Relative: 1 %
Eosinophils Absolute: 0.1 10*3/uL (ref 0.0–0.5)
Eosinophils Relative: 2 %
HCT: 46.7 % (ref 39.0–52.0)
Hemoglobin: 15.7 g/dL (ref 13.0–17.0)
Immature Granulocytes: 0 %
Lymphocytes Relative: 21 %
Lymphs Abs: 1.3 10*3/uL (ref 0.7–4.0)
MCH: 30.4 pg (ref 26.0–34.0)
MCHC: 33.6 g/dL (ref 30.0–36.0)
MCV: 90.5 fL (ref 80.0–100.0)
Monocytes Absolute: 0.5 10*3/uL (ref 0.1–1.0)
Monocytes Relative: 8 %
Neutro Abs: 4.3 10*3/uL (ref 1.7–7.7)
Neutrophils Relative %: 68 %
Platelets: 197 10*3/uL (ref 150–400)
RBC: 5.16 MIL/uL (ref 4.22–5.81)
RDW: 12.8 % (ref 11.5–15.5)
WBC: 6.3 10*3/uL (ref 4.0–10.5)
nRBC: 0 % (ref 0.0–0.2)

## 2022-07-26 LAB — URINALYSIS, ROUTINE W REFLEX MICROSCOPIC
Bilirubin Urine: NEGATIVE
Glucose, UA: NEGATIVE mg/dL
Hgb urine dipstick: NEGATIVE
Ketones, ur: NEGATIVE mg/dL
Leukocytes,Ua: NEGATIVE
Nitrite: NEGATIVE
Protein, ur: NEGATIVE mg/dL
Specific Gravity, Urine: 1.017 (ref 1.005–1.030)
pH: 6 (ref 5.0–8.0)

## 2022-07-26 LAB — COMPREHENSIVE METABOLIC PANEL
ALT: 17 U/L (ref 0–44)
AST: 18 U/L (ref 15–41)
Albumin: 4.3 g/dL (ref 3.5–5.0)
Alkaline Phosphatase: 77 U/L (ref 38–126)
Anion gap: 7 (ref 5–15)
BUN: 17 mg/dL (ref 8–23)
CO2: 26 mmol/L (ref 22–32)
Calcium: 9.2 mg/dL (ref 8.9–10.3)
Chloride: 104 mmol/L (ref 98–111)
Creatinine, Ser: 1.46 mg/dL — ABNORMAL HIGH (ref 0.61–1.24)
GFR, Estimated: 53 mL/min — ABNORMAL LOW (ref >60)
Glucose, Bld: 80 mg/dL (ref 70–99)
Potassium: 4.1 mmol/L (ref 3.5–5.1)
Sodium: 137 mmol/L (ref 135–145)
Total Bilirubin: 0.8 mg/dL (ref 0.3–1.2)
Total Protein: 7.1 g/dL (ref 6.5–8.1)

## 2022-07-26 LAB — LIPASE, BLOOD: Lipase: 31 U/L (ref 11–51)

## 2022-07-26 MED ORDER — OXYCODONE-ACETAMINOPHEN 5-325 MG PO TABS
2.0000 | ORAL_TABLET | Freq: Once | ORAL | Status: AC
  Administered 2022-07-26: 2 via ORAL
  Filled 2022-07-26: qty 2

## 2022-07-26 MED ORDER — TAMSULOSIN HCL 0.4 MG PO CAPS
0.4000 mg | ORAL_CAPSULE | Freq: Once | ORAL | Status: AC
  Administered 2022-07-26: 0.4 mg via ORAL
  Filled 2022-07-26: qty 1

## 2022-07-26 MED ORDER — SODIUM CHLORIDE 0.9 % IV BOLUS
1000.0000 mL | Freq: Once | INTRAVENOUS | Status: AC
  Administered 2022-07-26: 1000 mL via INTRAVENOUS

## 2022-07-26 NOTE — Discharge Instructions (Addendum)
Please take Tylenol for pain.  If this continues to persist throughout the weekend I would follow-up with your primary care doctor if you have one.  You may return to the emergency department anytime for any worsening symptoms.

## 2022-07-26 NOTE — ED Notes (Signed)
Dc instructions and scripts reviewed with pt no questions or concerns at this time.  

## 2022-07-26 NOTE — ED Triage Notes (Signed)
Pt c/o left flank pain x 4 days; pt states he was lifting some heavy wood this past weekend;  Pt states he does have a hx of kidney stones but does not have any urinary sx at present

## 2022-07-26 NOTE — ED Provider Notes (Signed)
[facility] EMERGENCY DEPARTMENT Provider Note   CSN: [id number] Arrival date & time: 07/26/22  0940     History Chief Complaint  Patient presents with   Flank Pain    Donald Zhang is a 66 y.o. male patient with history of kidney stones who presents to the emergency department with left-sided flank pain that started 4 days ago.  He states the day prior he was doing a lot of physical labor moving 2 by fours and putting them up over his head.  The next day he woke up with a very sharp and sore sensation.  He does state that this feels somewhat similar to kidney stones that he had in the past.  He denies any dysuria, urinary frequency, or hematuria.  He does endorse some urinary urgency but this was going on prior to the pain starting.  Denies any nausea, vomiting, abdominal pain.   Flank Pain       Home Medications Prior to Admission medications   Medication Sig Start Date End Date Taking? Authorizing Provider  allopurinol (ZYLOPRIM) 100 MG tablet Take 0.5 tablets (50 mg total) by mouth daily. 08/14/21 07/26/22 Yes SagardiaInes Bloomer, MD  aspirin EC 81 MG tablet Take 1 tablet (81 mg total) by mouth daily. 06/13/16  Yes Shawnee Knapp, MD  atorvastatin (LIPITOR) 40 MG tablet Take 1 tablet by mouth once daily 03/06/22  Yes Sagardia, Ines Bloomer, MD  lisinopril-hydrochlorothiazide (ZESTORETIC) 20-25 MG tablet Take 1 tablet by mouth once daily 03/06/22  Yes Sagardia, Ines Bloomer, MD  methimazole (TAPAZOLE) 5 MG tablet Take 1 tablet (5 mg total) by mouth daily. 03/07/22  Yes Elayne Snare, MD  sildenafil (VIAGRA) 100 MG tablet Take 1 tablet (100 mg total) by mouth as needed for erectile dysfunction. 09/19/20  Yes Sagardia, Ines Bloomer, MD  cyclobenzaprine (FLEXERIL) 10 MG tablet Take 1 tablet (10 mg total) by mouth 3 (three) times daily as needed for muscle spasms. Patient not taking: Reported on 07/26/2022 03/20/21   Horald Pollen, MD  ibuprofen (ADVIL) 800 MG tablet Take 1 tablet  (800 mg total) by mouth every 8 (eight) hours as needed. Patient not taking: Reported on 07/26/2022 03/09/21   Milton Ferguson, MD  triamcinolone (KENALOG) 0.1 % Apply 1 application topically 2 (two) times daily. Patient not taking: Reported on 07/26/2022 09/19/20   Horald Pollen, MD      Allergies    Patient has no known allergies.    Review of Systems   Review of Systems  Genitourinary:  Positive for flank pain.  All other systems reviewed and are negative.   Physical Exam Updated Vital Signs BP (!) 140/85   Pulse (!) 53   Temp 98.4 F (36.9 C) (Oral)   Resp 18   Ht '5\' 7"'$  (1.702 m)   Wt 85.3 kg   SpO2 100%   BMI 29.44 kg/m  Physical Exam Vitals and nursing note reviewed.  Constitutional:      General: He is not in acute distress.    Appearance: Normal appearance.  HENT:     Head: Normocephalic and atraumatic.  Eyes:     General:        Right eye: No discharge.        Left eye: No discharge.  Cardiovascular:     Comments: Regular rate and rhythm.  S1/S2 are distinct without any evidence of murmur, rubs, or gallops.  Radial pulses are 2+ bilaterally.  Dorsalis pedis pulses are 2+ bilaterally.  No  evidence of pedal edema. Pulmonary:     Comments: Clear to auscultation bilaterally.  Normal effort.  No respiratory distress.  No evidence of wheezes, rales, or rhonchi heard throughout. Abdominal:     General: Abdomen is flat. Bowel sounds are normal. There is no distension.     Tenderness: There is no abdominal tenderness. There is no right CVA tenderness, left CVA tenderness, guarding or rebound.  Musculoskeletal:        General: Normal range of motion.     Cervical back: Neck supple.  Skin:    General: Skin is warm and dry.     Findings: No rash.  Neurological:     General: No focal deficit present.     Mental Status: He is alert.  Psychiatric:        Mood and Affect: Mood normal.        Behavior: Behavior normal.     ED Results / Procedures / Treatments    Labs (all labs ordered are listed, but only abnormal results are displayed) Labs Reviewed  COMPREHENSIVE METABOLIC PANEL - Abnormal; Notable for the following components:      Result Value   Creatinine, Ser 1.46 (*)    GFR, Estimated 53 (*)    All other components within normal limits  CBC WITH DIFFERENTIAL/PLATELET  LIPASE, BLOOD  URINALYSIS, ROUTINE W REFLEX MICROSCOPIC    EKG None  Radiology CT Renal Stone Study  Result Date: 07/26/2022 CLINICAL DATA:  Flank pain, kidney stone suspected. EXAM: CT ABDOMEN AND PELVIS WITHOUT CONTRAST TECHNIQUE: Multidetector CT imaging of the abdomen and pelvis was performed following the standard protocol without IV contrast. RADIATION DOSE REDUCTION: This exam was performed according to the departmental dose-optimization program which includes automated exposure control, adjustment of the mA and/or kV according to patient size and/or use of iterative reconstruction technique. COMPARISON:  CT March 09, 2021 FINDINGS: Lower chest: Similar chronic scarring in the right lung base. Hepatobiliary: Stable subcentimeter hypodensity in the inferior right lobe of the liver on image 28/2 technically too small to accurately characterize but statistically likely to reflect a cyst. Gallbladder is unremarkable. No biliary ductal dilation. Pancreas: No pancreatic ductal dilation or evidence of acute inflammation. Spleen: No splenomegaly. Adrenals/Urinary Tract: Stable bilateral adrenal adenomas measuring up to 16 mm in the right adrenal gland on image 23/2, compatible with a benign finding and requiring no independent imaging follow-up. No hydronephrosis. Similar right renal atrophy. Punctate nonobstructive left renal stones measure up to 2 mm. No obstructive ureteral or bladder calculi. Urinary bladder is unremarkable for degree of distension. Stomach/Bowel: No radiopaque enteric contrast material was administered. Stomach is unremarkable for degree of distension. No  pathologic dilation of small or large bowel. The appendix and terminal ileum appear normal. Left-sided colonic diverticulosis without findings of acute diverticulitis. Vascular/Lymphatic: Aortic atherosclerosis. No pathologically enlarged abdominal or pelvic lymph nodes. Reproductive: Dystrophic prostatic calcifications. Other: No significant abdominopelvic free fluid. Musculoskeletal: No acute osseous abnormality. Multilevel degenerative changes spine. Degenerative change of the bilateral hips and SI joints with partial bony ankylosis of the right SI joint. IMPRESSION: 1. Punctate nonobstructive left renal stones measure up to 2 mm. No obstructive ureteral or bladder calculi. 2. Chronic right renal atrophy. 3. Left-sided colonic diverticulosis without findings of acute diverticulitis. 4.  Aortic Atherosclerosis (ICD10-I70.0). Electronically Signed   By: Dahlia Bailiff M.D.   On: 07/26/2022 11:22    Procedures Procedures    Medications Ordered in ED Medications  oxyCODONE-acetaminophen (PERCOCET/ROXICET) 5-325 MG per tablet 2  tablet (2 tablets Oral Given 07/26/22 1041)  tamsulosin (FLOMAX) capsule 0.4 mg (0.4 mg Oral Given 07/26/22 1158)  sodium chloride 0.9 % bolus 1,000 mL (1,000 mLs Intravenous New Bag/Given 07/26/22 1154)    ED Course/ Medical Decision Making/ A&P Clinical Course as of 07/26/22 1410  Thu Jul 26, 2022  1207 CBC with Differential Normal. [CF]  1207 Comprehensive metabolic panel(!) Elevated creatinine but this is improved from the patient's baseline. [CF]  1207 Lipase, blood Normal. [CF]  1207 Urinalysis, Routine w reflex microscopic Urine, Clean Catch Normal. [CF]  1208 CT Renal Stone Study I personally ordered and interpreted a renal stone study and did not see any evidence ureteral stone.  This could be a pulled muscle. [CF]    Clinical Course User Index [CF] Hendricks Limes, PA-C                           Medical Decision Making Donald Zhang is a 66 y.o.  male patient who presents to the emergency department today for further evaluation of left-sided flank pain.  There is no significant CVA tenderness.  No midline lumbar or thoracic spinal tenderness.  Given that the patient does have history of kidney stones and states this feels somewhat similar I will get labs and a CT renal stone study.  This could also be just muscular spasm or pulled muscle from the physical labor he was doing the day prior.  I will give him to Percocet for pain control.  I will plan to reassess once of the labs and imaging results.  Patient feeling much better after Norco, fluids, and Flomax.  CT scan did not show any signs of ureterolithiasis.  There is some nephrolithiasis.  Suspect this is likely a pulled muscle.  I will have him take Tylenol for pain.  I will have him follow-up with his primary care doctor.  Strict return precautions were discussed.  He is safe for discharge.  Amount and/or Complexity of Data Reviewed Labs: ordered. Decision-making details documented in ED Course. Radiology: ordered. Decision-making details documented in ED Course.  Risk Prescription drug management.   Final Clinical Impression(s) / ED Diagnoses Final diagnoses:  Flank pain    Rx / DC Orders ED Discharge Orders     None         Cherrie Gauze 07/26/22 1410    Davonna Belling, MD 07/26/22 1534

## 2022-12-29 LAB — LAB REPORT - SCANNED
Creatinine, POC: 48.2 mg/dL
Protein/Creatinine Ratio: 222

## 2023-03-04 ENCOUNTER — Telehealth: Payer: Self-pay | Admitting: Endocrinology

## 2023-03-04 DIAGNOSIS — E0590 Thyrotoxicosis, unspecified without thyrotoxic crisis or storm: Secondary | ICD-10-CM

## 2023-03-04 MED ORDER — METHIMAZOLE 5 MG PO TABS: 5.0000 mg | ORAL_TABLET | Freq: Every day | ORAL | 0 refills | Status: DC

## 2023-03-04 NOTE — Telephone Encounter (Signed)
MEDICATION: methimazole (TAPAZOLE) 5 MG tablet  PHARMACY:  [id number] - [place], [place]   HAS THE PATIENT CONTACTED THEIR PHARMACY?  yes  IS THIS A 90 DAY SUPPLY : just until August visit  IS PATIENT OUT OF MEDICATION: yes   IF NOT; HOW MUCH IS LEFT:   LAST APPOINTMENT DATE: @04 /17/2023  NEXT APPOINTMENT DATE:@8 /03/2023  DO WE HAVE YOUR PERMISSION TO LEAVE A DETAILED MESSAGE?: yes  OTHER COMMENTS:    **Let patient know to contact pharmacy at the end of the day to make sure medication is ready. **  ** Please notify patient to allow 48-72 hours to process**  **Encourage patient to contact the pharmacy for refills or they can request refills through [facility]**

## 2023-03-04 NOTE — Telephone Encounter (Signed)
Refill sent.

## 2023-03-25 IMAGING — CT CT RENAL STONE PROTOCOL
2 of 4 series · 16 of 46 positions shown, 18 images · non-contrast
Comparison: Ultrasound 04/21/2018.  CT 02/04/2015.

CLINICAL DATA: Left-sided flank pain.  Kidney stone suspected.

EXAM:
CT ABDOMEN AND PELVIS WITHOUT CONTRAST
TECHNIQUE: Multidetector CT imaging of the abdomen and pelvis was performed
following the standard protocol without IV contrast.

[Series 2: axial st · axial · 0.74mm/px · z∈[+929,+1324]mm · 13 of 89 slices shown, 15 images]
[im 5/89  soft-tissue]
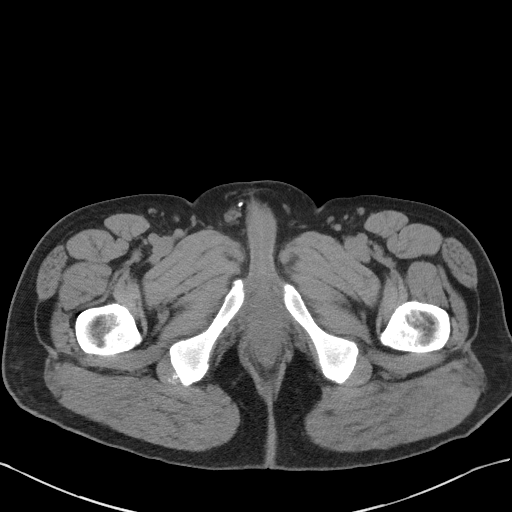
[im 5/89  bone]
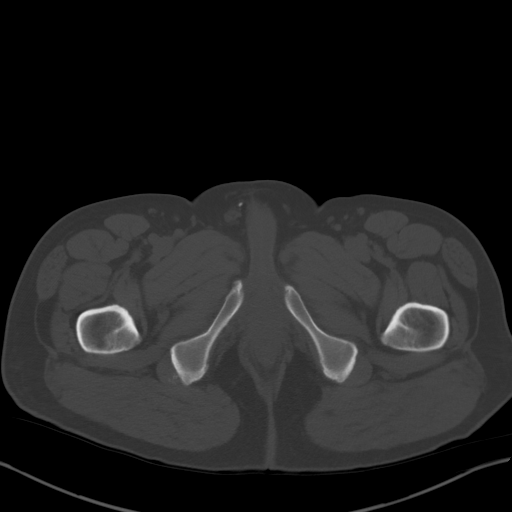
[im 14/89  soft-tissue]
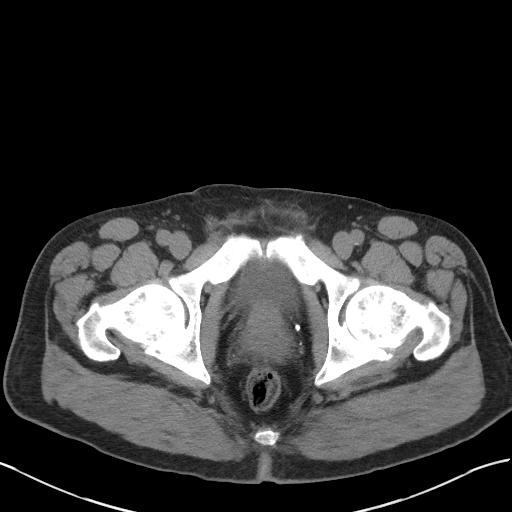
[im 18/89  soft-tissue]
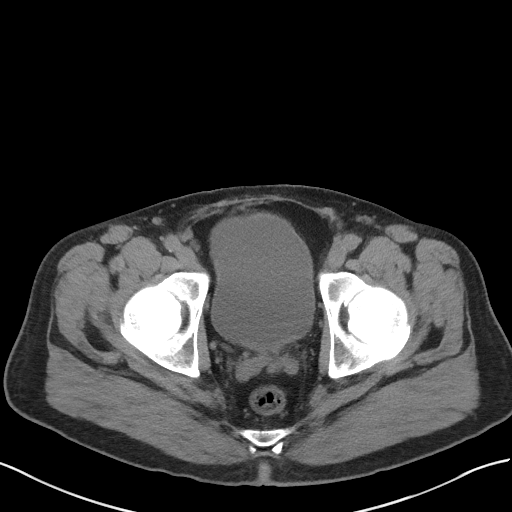
[im 27/89  soft-tissue]
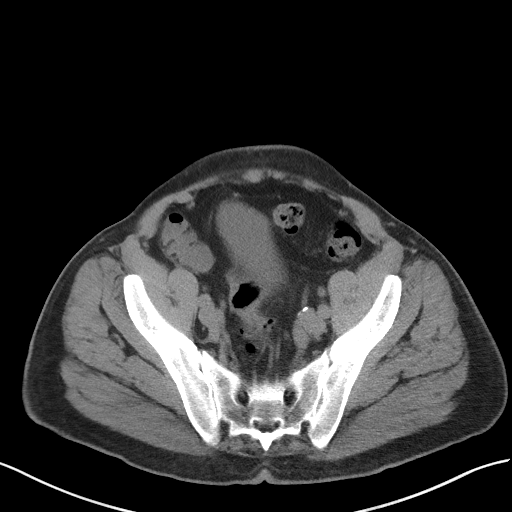
[im 31/89  soft-tissue]
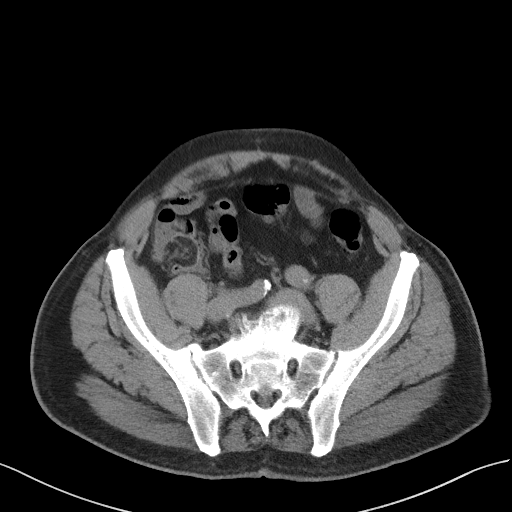
[im 40/89  soft-tissue]
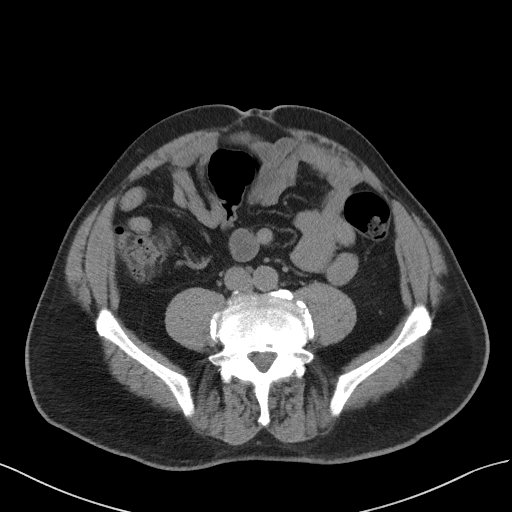
[im 45/89  soft-tissue]
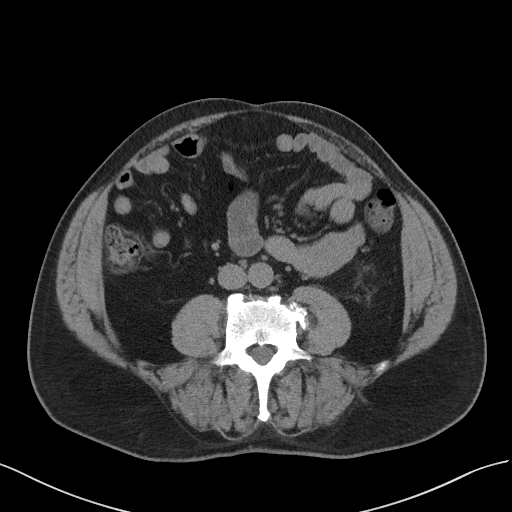
[im 49/89  soft-tissue]
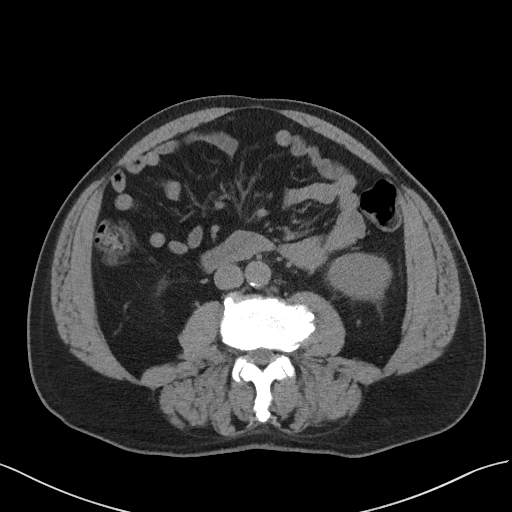
[im 58/89  soft-tissue]
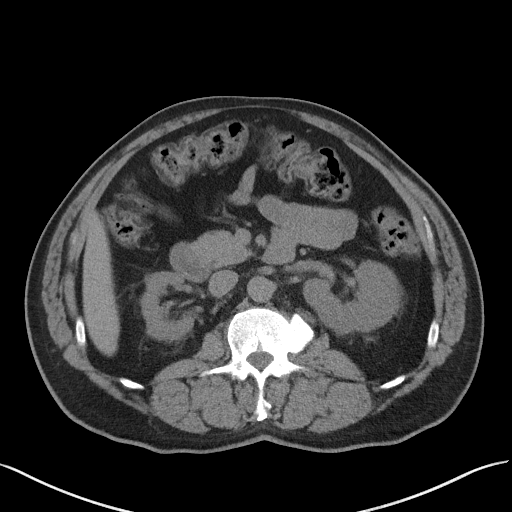
[im 58/89  bone]
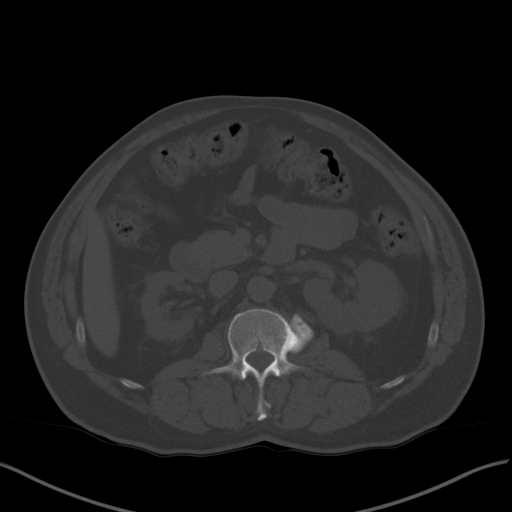
[im 62/89  soft-tissue]
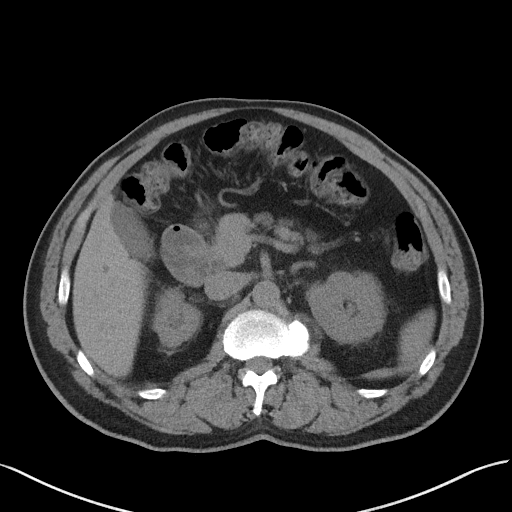
[im 71/89  soft-tissue]
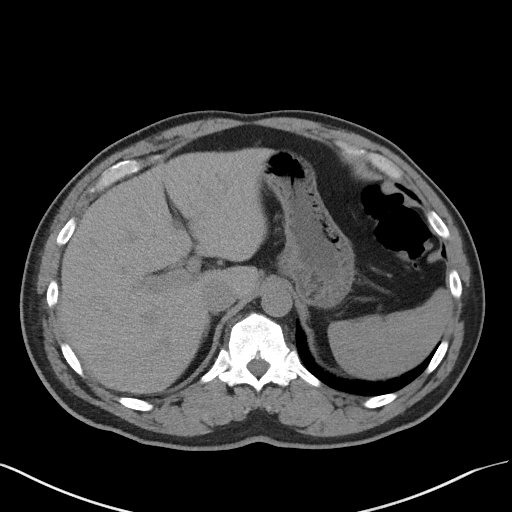
[im 75/89  soft-tissue]
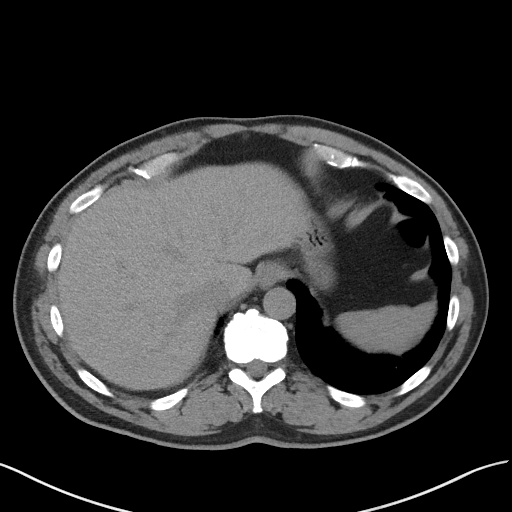
[im 84/89  soft-tissue]
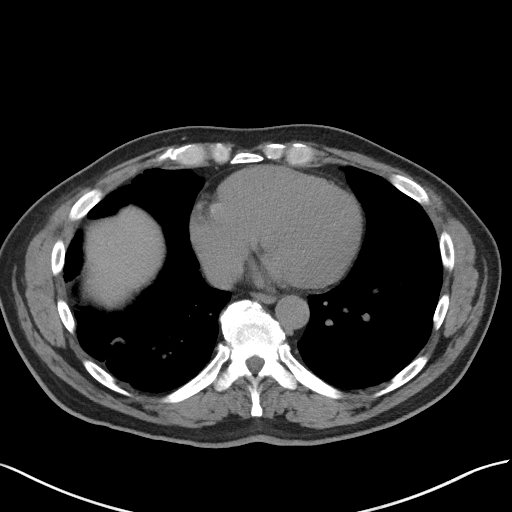

[Series 5: coronal st · coronal · 0.75mm/px · 3 of 101 slices shown]
[im 34/101  soft-tissue]
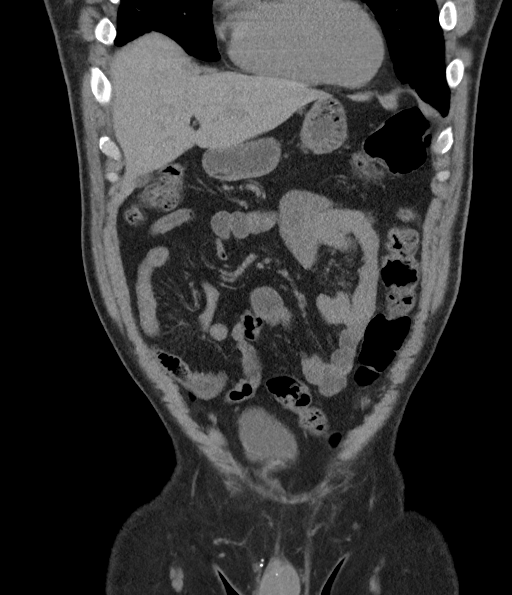
[im 45/101  soft-tissue]
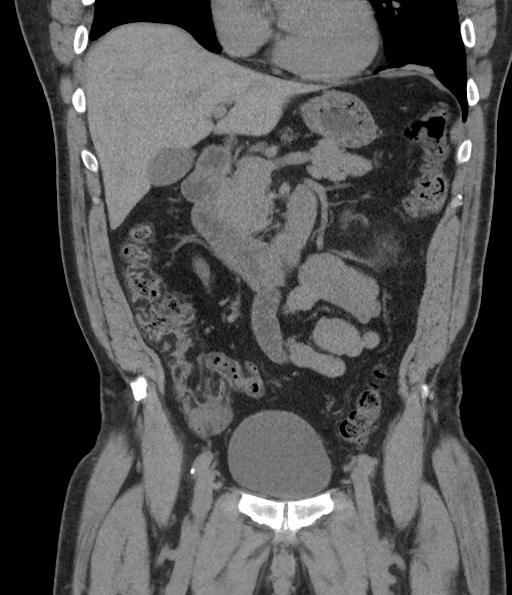
[im 56/101  soft-tissue]
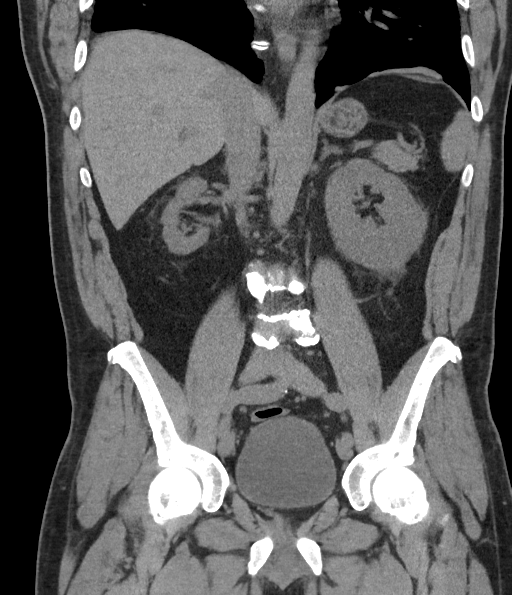

[16 of 46 positions shown; findings below may reference images not displayed]

FINDINGS: Lower chest: Chronic scarring at the right lung base. No active
process in the lower chest.

Hepatobiliary: Liver parenchyma appears normal without contrast. No
calcified gallstones.

Pancreas: Normal

Spleen: Normal

Adrenals/Urinary Tract: Bilateral low-density adrenal nodules
consistent with adenomas. Maximal dimension on the right measures 18
mm. Maximal dimension on the left measures 14 mm. No change since
previous study. The right kidney shows atrophy. Evidence right-sided
stone or hydronephrosis. The left kidney shows mild compensatory
hypertrophy. Few punctate nonobstructing stones in the lower pole.
No hydroureteronephrosis. No evidence of passing stone. No stone in
the bladder.

Stomach/Bowel: Stomach and small intestine are normal. Normal
appendix. No colon abnormality.

Vascular/Lymphatic: Minimal aortic atherosclerosis. No aneurysm. IVC
is normal. No retroperitoneal adenopathy.

Reproductive: Normal

Other: No free fluid or air.

Musculoskeletal: Ordinary chronic lower lumbar degenerative changes.
IMPRESSION: No abnormality seen to explain left flank pain currently. There are
few punctate nonobstructing stones in the lower pole left kidney but
no evidence of hydroureteronephrosis presently or passing stone. No
stone in the bladder. Could a small stone have recently passed?

Chronic scarring at the right lung base.

Chronic relative atrophy of the right kidney. No stone disease or
acute finding on that side.

Aortic atherosclerosis.

Ordinary lumbar degenerative changes.

Low-density adrenal adenomas, unchanged since 4781 and not
significant.

## 2023-03-30 ENCOUNTER — Other Ambulatory Visit: Payer: Self-pay | Admitting: Endocrinology

## 2023-03-30 DIAGNOSIS — E0590 Thyrotoxicosis, unspecified without thyrotoxic crisis or storm: Secondary | ICD-10-CM

## 2023-05-01 ENCOUNTER — Ambulatory Visit (INDEPENDENT_AMBULATORY_CARE_PROVIDER_SITE_OTHER): Payer: Medicare Other | Admitting: "Endocrinology

## 2023-05-01 ENCOUNTER — Encounter: Payer: Self-pay | Admitting: "Endocrinology

## 2023-05-01 ENCOUNTER — Other Ambulatory Visit: Payer: Self-pay | Admitting: "Endocrinology

## 2023-05-01 VITALS — BP 145/65 | HR 80 | Ht 67.0 in | Wt 165.2 lb

## 2023-05-01 DIAGNOSIS — E010 Iodine-deficiency related diffuse (endemic) goiter: Secondary | ICD-10-CM

## 2023-05-01 DIAGNOSIS — E0590 Thyrotoxicosis, unspecified without thyrotoxic crisis or storm: Secondary | ICD-10-CM

## 2023-05-01 LAB — TSH: TSH: 0 u[IU]/mL — ABNORMAL LOW (ref 0.35–5.50)

## 2023-05-01 LAB — T4, FREE: Free T4: 2.33 ng/dL — ABNORMAL HIGH (ref 0.60–1.60)

## 2023-05-01 LAB — T3, FREE: T3, Free: 6.9 pg/mL — ABNORMAL HIGH (ref 2.3–4.2)

## 2023-05-01 MED ORDER — METHIMAZOLE 10 MG PO TABS: 10.0000 mg | ORAL_TABLET | Freq: Every day | ORAL | 1 refills | Status: DC
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
## 2023-05-01 NOTE — Patient Instructions (Signed)
-  Complications of untreated hyperthyroidism include atrial fibrillation, heart failure and osteoporosis -Side effects of Methimazole including but not limited to allergic reaction, rash, bone marrow suppression, liver dysfunction   If you notice any symptoms of worsening fatigue, fever with sore throat, loss of appetite, yellowing of eyes, dark urine, joint pains, sores in the mouth, itchy rash, light colored stools or abdominal pain, please stop the medication and call us immediately as this can be a serious side effect of the medication.

## 2023-05-15 ENCOUNTER — Ambulatory Visit (HOSPITAL_COMMUNITY)
Admission: RE | Admit: 2023-05-15 | Discharge: 2023-05-15 | Disposition: A | Discharge status: Home / Self Care | Payer: Medicare Other | Source: Ambulatory Visit | Attending: "Endocrinology | Admitting: "Endocrinology

## 2023-05-15 DIAGNOSIS — E010 Iodine-deficiency related diffuse (endemic) goiter: Secondary | ICD-10-CM | POA: Diagnosis present

## 2023-05-15 DIAGNOSIS — E0590 Thyrotoxicosis, unspecified without thyrotoxic crisis or storm: Secondary | ICD-10-CM | POA: Diagnosis not present

## 2023-06-17 ENCOUNTER — Other Ambulatory Visit: Payer: Self-pay | Admitting: Emergency Medicine

## 2023-06-17 DIAGNOSIS — E785 Hyperlipidemia, unspecified: Secondary | ICD-10-CM

## 2023-08-01 ENCOUNTER — Encounter: Payer: Self-pay | Admitting: "Endocrinology

## 2023-08-01 ENCOUNTER — Ambulatory Visit: Payer: Medicare HMO | Admitting: "Endocrinology

## 2023-08-01 VITALS — BP 120/62 | HR 82 | Ht 67.0 in | Wt 160.6 lb

## 2023-08-01 DIAGNOSIS — E0500 Thyrotoxicosis with diffuse goiter without thyrotoxic crisis or storm: Secondary | ICD-10-CM | POA: Diagnosis not present

## 2023-08-01 DIAGNOSIS — E0590 Thyrotoxicosis, unspecified without thyrotoxic crisis or storm: Secondary | ICD-10-CM | POA: Diagnosis not present

## 2023-08-01 DIAGNOSIS — E049 Nontoxic goiter, unspecified: Secondary | ICD-10-CM

## 2023-08-01 LAB — TSH: TSH: <0.01 u[IU]/mL (ref 0.35–5.50)

## 2023-08-01 LAB — T3, FREE: T3, Free: 3.9 pg/mL (ref 2.3–4.2)

## 2023-08-01 LAB — T4, FREE: Free T4: 0.96 ng/dL (ref 0.60–1.60)
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
## 2023-09-02 DIAGNOSIS — J449 Chronic obstructive pulmonary disease, unspecified: Secondary | ICD-10-CM | POA: Diagnosis not present

## 2023-09-02 DIAGNOSIS — M109 Gout, unspecified: Secondary | ICD-10-CM | POA: Diagnosis not present

## 2023-09-02 DIAGNOSIS — I129 Hypertensive chronic kidney disease with stage 1 through stage 4 chronic kidney disease, or unspecified chronic kidney disease: Secondary | ICD-10-CM | POA: Diagnosis not present

## 2023-09-02 DIAGNOSIS — N189 Chronic kidney disease, unspecified: Secondary | ICD-10-CM | POA: Diagnosis not present

## 2023-09-02 DIAGNOSIS — Z7982 Long term (current) use of aspirin: Secondary | ICD-10-CM | POA: Diagnosis not present

## 2023-09-02 DIAGNOSIS — E0500 Thyrotoxicosis with diffuse goiter without thyrotoxic crisis or storm: Secondary | ICD-10-CM | POA: Diagnosis not present

## 2023-09-02 DIAGNOSIS — Z008 Encounter for other general examination: Secondary | ICD-10-CM | POA: Diagnosis not present

## 2023-09-02 DIAGNOSIS — E785 Hyperlipidemia, unspecified: Secondary | ICD-10-CM | POA: Diagnosis not present

## 2023-09-02 DIAGNOSIS — Z720 Tobacco use: Secondary | ICD-10-CM | POA: Diagnosis not present

## 2023-09-02 DIAGNOSIS — M1990 Unspecified osteoarthritis, unspecified site: Secondary | ICD-10-CM | POA: Diagnosis not present

## 2023-10-24 ENCOUNTER — Other Ambulatory Visit: Payer: Self-pay

## 2023-10-24 ENCOUNTER — Other Ambulatory Visit: Payer: Self-pay | Admitting: "Endocrinology

## 2023-10-24 DIAGNOSIS — E0500 Thyrotoxicosis with diffuse goiter without thyrotoxic crisis or storm: Secondary | ICD-10-CM

## 2023-10-28 ENCOUNTER — Other Ambulatory Visit: Payer: Medicare HMO

## 2023-10-28 DIAGNOSIS — E0500 Thyrotoxicosis with diffuse goiter without thyrotoxic crisis or storm: Secondary | ICD-10-CM | POA: Diagnosis not present

## 2023-10-28 LAB — T4, FREE: Free T4: 0.8 ng/dL (ref 0.8–1.8)

## 2023-10-28 LAB — T3, FREE: T3, Free: 3.1 pg/mL (ref 2.3–4.2)

## 2023-10-28 LAB — TSH: TSH: 1.96 m[IU]/L (ref 0.40–4.50)

## 2023-11-01 ENCOUNTER — Ambulatory Visit (INDEPENDENT_AMBULATORY_CARE_PROVIDER_SITE_OTHER): Payer: Medicare HMO | Admitting: "Endocrinology

## 2023-11-01 ENCOUNTER — Encounter: Payer: Self-pay | Admitting: "Endocrinology

## 2023-11-01 VITALS — BP 110/80 | HR 64 | Resp 20 | Ht 67.0 in | Wt 176.0 lb

## 2023-11-01 DIAGNOSIS — F17200 Nicotine dependence, unspecified, uncomplicated: Secondary | ICD-10-CM

## 2023-11-01 DIAGNOSIS — E0500 Thyrotoxicosis with diffuse goiter without thyrotoxic crisis or storm: Secondary | ICD-10-CM

## 2023-11-01 DIAGNOSIS — E0590 Thyrotoxicosis, unspecified without thyrotoxic crisis or storm: Secondary | ICD-10-CM | POA: Diagnosis not present

## 2023-11-01 MED ORDER — METHIMAZOLE 10 MG PO TABS: 10.0000 mg | ORAL_TABLET | Freq: Every day | ORAL | 1 refills | Status: DC
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
## 2024-01-27 DIAGNOSIS — Z720 Tobacco use: Secondary | ICD-10-CM | POA: Diagnosis not present

## 2024-01-27 DIAGNOSIS — N2581 Secondary hyperparathyroidism of renal origin: Secondary | ICD-10-CM | POA: Diagnosis not present

## 2024-01-27 DIAGNOSIS — M109 Gout, unspecified: Secondary | ICD-10-CM | POA: Diagnosis not present

## 2024-01-27 DIAGNOSIS — D631 Anemia in chronic kidney disease: Secondary | ICD-10-CM | POA: Diagnosis not present

## 2024-01-27 DIAGNOSIS — I129 Hypertensive chronic kidney disease with stage 1 through stage 4 chronic kidney disease, or unspecified chronic kidney disease: Secondary | ICD-10-CM | POA: Diagnosis not present

## 2024-01-27 DIAGNOSIS — N1831 Chronic kidney disease, stage 3a: Secondary | ICD-10-CM | POA: Diagnosis not present

## 2024-01-28 LAB — LAB REPORT - SCANNED
Creatinine, POC: 128.7 mg/dL
EGFR: 44

## 2024-04-24 ENCOUNTER — Other Ambulatory Visit: Payer: Self-pay | Admitting: "Endocrinology

## 2024-04-24 DIAGNOSIS — E0590 Thyrotoxicosis, unspecified without thyrotoxic crisis or storm: Secondary | ICD-10-CM

## 2024-04-24 NOTE — Telephone Encounter (Signed)
 Requested Prescriptions   Pending Prescriptions Disp Refills   methimazole  (TAPAZOLE ) 10 MG tablet [Pharmacy Med Name: methIMAzole  10 MG Oral Tablet] 90 tablet 0    Sig: Take 1 tablet by mouth once daily

## 2024-04-27 ENCOUNTER — Other Ambulatory Visit: Payer: Self-pay | Admitting: Emergency Medicine

## 2024-04-27 DIAGNOSIS — E785 Hyperlipidemia, unspecified: Secondary | ICD-10-CM

## 2024-07-21 ENCOUNTER — Other Ambulatory Visit: Payer: Self-pay | Admitting: "Endocrinology

## 2024-07-21 DIAGNOSIS — E0590 Thyrotoxicosis, unspecified without thyrotoxic crisis or storm: Secondary | ICD-10-CM

## 2024-07-24 NOTE — Telephone Encounter (Signed)
 Requested Prescriptions   Pending Prescriptions Disp Refills   methimazole  (TAPAZOLE ) 10 MG tablet [Pharmacy Med Name: methIMAzole  10 MG Oral Tablet] 90 tablet 0    Sig: Take 1 tablet by mouth once daily

## 2024-10-20 ENCOUNTER — Other Ambulatory Visit: Payer: Self-pay | Admitting: "Endocrinology

## 2024-10-20 DIAGNOSIS — E0590 Thyrotoxicosis, unspecified without thyrotoxic crisis or storm: Secondary | ICD-10-CM
